# Patient Record
Sex: Male | Born: 1985 | Race: White | Hispanic: No | Marital: Married | Smoking: Never smoker
Health system: Southern US, Community
[De-identification: ages and names within clinical notes are randomized; demographics above are authoritative.]

## PROBLEM LIST (undated history)

## (undated) DIAGNOSIS — M5126 Other intervertebral disc displacement, lumbar region: Secondary | ICD-10-CM

## (undated) DIAGNOSIS — E785 Hyperlipidemia, unspecified: Secondary | ICD-10-CM

## (undated) HISTORY — DX: Other intervertebral disc displacement, lumbar region: M51.26

## (undated) HISTORY — DX: Hyperlipidemia, unspecified: E78.5

---
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
## 2012-07-18 ENCOUNTER — Encounter: Payer: Self-pay | Admitting: *Deleted

## 2012-07-27 ENCOUNTER — Ambulatory Visit (INDEPENDENT_AMBULATORY_CARE_PROVIDER_SITE_OTHER): Payer: BC Managed Care – PPO | Admitting: Family Medicine

## 2012-07-27 ENCOUNTER — Encounter: Payer: Self-pay | Admitting: Family Medicine

## 2012-07-27 VITALS — BP 126/78 | HR 86 | Temp 98.0°F | Resp 16 | Wt 187.0 lb

## 2012-07-27 DIAGNOSIS — L639 Alopecia areata, unspecified: Secondary | ICD-10-CM

## 2012-07-27 LAB — CBC WITH DIFFERENTIAL/PLATELET
Basophils Absolute: 0 10*3/uL (ref 0.0–0.1)
Basophils Relative: 1 % (ref 0–1)
Eosinophils Absolute: 0.3 10*3/uL (ref 0.0–0.7)
Eosinophils Relative: 4 % (ref 0–5)
HCT: 47.4 % (ref 39.0–52.0)
Hemoglobin: 16.7 g/dL (ref 13.0–17.0)
Lymphocytes Relative: 35 % (ref 12–46)
Lymphs Abs: 2.6 10*3/uL (ref 0.7–4.0)
MCH: 28.9 pg (ref 26.0–34.0)
MCHC: 35.2 g/dL (ref 30.0–36.0)
MCV: 82.1 fL (ref 78.0–100.0)
Monocytes Absolute: 0.7 10*3/uL (ref 0.1–1.0)
Monocytes Relative: 10 % (ref 3–12)
Neutro Abs: 3.9 10*3/uL (ref 1.7–7.7)
Neutrophils Relative %: 50 % (ref 43–77)
Platelets: 213 10*3/uL (ref 150–400)
RBC: 5.77 MIL/uL (ref 4.22–5.81)
RDW: 13.7 % (ref 11.5–15.5)
WBC: 7.6 10*3/uL (ref 4.0–10.5)

## 2012-07-27 LAB — BASIC METABOLIC PANEL
BUN: 16 mg/dL (ref 6–23)
CO2: 28 mEq/L (ref 19–32)
Calcium: 10.1 mg/dL (ref 8.4–10.5)
Chloride: 104 mEq/L (ref 96–112)
Creat: 0.94 mg/dL (ref 0.50–1.35)
Glucose, Bld: 84 mg/dL (ref 70–99)
Potassium: 4.8 mEq/L (ref 3.5–5.3)
Sodium: 141 mEq/L (ref 135–145)

## 2012-07-27 LAB — HEPATIC FUNCTION PANEL
ALT: 47 U/L (ref 0–53)
AST: 36 U/L (ref 0–37)
Albumin: 4.6 g/dL (ref 3.5–5.2)
Alkaline Phosphatase: 123 U/L — ABNORMAL HIGH (ref 39–117)
Bilirubin, Direct: 0.1 mg/dL (ref 0.0–0.3)
Indirect Bilirubin: 0.4 mg/dL (ref 0.0–0.9)
Total Bilirubin: 0.5 mg/dL (ref 0.3–1.2)
Total Protein: 7.9 g/dL (ref 6.0–8.3)

## 2012-07-27 LAB — TSH: TSH: 2.914 u[IU]/mL (ref 0.350–4.500)

## 2012-07-27 NOTE — Progress Notes (Signed)
Subjective:     Patient ID: Pedro Douglas, male   DOB: 08-11-1985, 27 y.o.   MRN: [id number]  HPI 27 year old white male presents with a 2 month history of hair loss.  There is a well circumscribed plaque on the crown of his head.  He has not been gradually enlarging.  He is completely bald in that spot.  There is no erythema or scale in the area of alopecia.  There are punctate hairs.  He has no hair loss anywhere else on his body including her eyelashes eyebrows genital region or beard.  Of note he has a 76-month-old newborn.  He states he has been under increased stress with the new addition.  He works as a Company secretary.  He is taking many medications, chemicals, and denies new exposures.  Past medical history is positive for asthma  He is on no medications  Has no allergies Review of Systems    review of systems is otherwise negative  Objective:   Physical Exam  Constitutional: He is oriented to person, place, and time. He appears well-developed and well-nourished.  HENT:  Head: Normocephalic and atraumatic.  Right Ear: External ear normal.  Left Ear: External ear normal.  Mouth/Throat: Oropharynx is clear and moist.  Eyes: Conjunctivae and EOM are normal. Pupils are equal, round, and reactive to light.  Neck: Normal range of motion. Neck supple. No thyromegaly present.  Cardiovascular: Normal rate, regular rhythm and normal heart sounds.  Exam reveals no friction rub.   No murmur heard. Pulmonary/Chest: Effort normal and breath sounds normal.  Abdominal: Soft. Bowel sounds are normal.  Musculoskeletal: Normal range of motion.  Neurological: He is oriented to person, place, and time. He has normal reflexes.   on the crown of his head, there is a 5 cm well-circumscribed patch of alopecia, without scale or erythema.  There are punctate hairs in the patch.       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 Discussed the natural history of alopecia areata advise him to clinically monitor for several months.Marland Kitchen  anticipate a self limited resolution as his stress level improves.  If symptoms worsen we can consult dermatology for possible intralesional corticosteroid injection.

## 2012-07-28 LAB — ANA: Anti Nuclear Antibody(ANA): NEGATIVE

## 2012-08-16 ENCOUNTER — Telehealth: Payer: Self-pay | Admitting: Family Medicine

## 2012-08-16 DIAGNOSIS — L639 Alopecia areata, unspecified: Secondary | ICD-10-CM

## 2012-08-16 NOTE — Telephone Encounter (Signed)
PT states he wants to discuss further treatment for his hair loss with dermatologist..says its getting better but not much

## 2012-08-16 NOTE — Telephone Encounter (Signed)
Tell him I will arrange [facility] consult.

## 2012-08-16 NOTE — Telephone Encounter (Signed)
Pt is aware and DERM appt has been set up for next Wednesday April 23

## 2012-12-23 ENCOUNTER — Encounter: Payer: Self-pay | Admitting: Family Medicine

## 2012-12-23 ENCOUNTER — Ambulatory Visit (INDEPENDENT_AMBULATORY_CARE_PROVIDER_SITE_OTHER): Payer: PRIVATE HEALTH INSURANCE | Admitting: Family Medicine

## 2012-12-23 VITALS — BP 100/60 | HR 60 | Temp 98.2°F | Resp 12 | Wt 176.0 lb

## 2012-12-23 DIAGNOSIS — J019 Acute sinusitis, unspecified: Secondary | ICD-10-CM

## 2012-12-23 MED ORDER — AMOXICILLIN 875 MG PO TABS: 875.0000 mg | ORAL_TABLET | Freq: Two times a day (BID) | ORAL | Status: DC

## 2012-12-23 MED ORDER — FLUTICASONE PROPIONATE 50 MCG/ACT NA SUSP: 2.0000 | Freq: Every day | NASAL | Status: DC

## 2012-12-23 NOTE — Progress Notes (Signed)
  Subjective:    Patient ID: Pedro Douglas, male    DOB: Feb 29, 1986, 27 y.o.   MRN: [id number]  HPI Patient had 2 weeks of sinus congestion, sinus pressure, and pain in the right maxillary sinus. He has had postnasal drip and sore throat. He has had subjective fevers. He denies any cough. Both ears feel full and congested. He is also feeling dizzy.  He also reports vague aching pain in his teeth. Past Medical History  Diagnosis Date  . Asthma 1996   No current outpatient prescriptions on file prior to visit.   No current facility-administered medications on file prior to visit.   No Known Allergies History   Social History  . Marital Status: Single    Spouse Name: N/A    Number of Children: N/A  . Years of Education: N/A   Occupational History  . Not on file.   Social History Main Topics  . Smoking status: Never Smoker   . Smokeless tobacco: Former Neurosurgeon    Types: Chew    Quit date: 05/06/2012  . Alcohol Use: No  . Drug Use: No  . Sexual Activity: Not on file   Other Topics Concern  . Not on file   Social History Narrative  . No narrative on file      Review of Systems  All other systems reviewed and are negative.       Objective:   Physical Exam  Vitals reviewed. Constitutional: He appears well-developed and well-nourished. No distress.  HENT:  Head: Normocephalic.  Right Ear: External ear normal.  Left Ear: External ear normal.  Nose: Right sinus exhibits maxillary sinus tenderness. Right sinus exhibits no frontal sinus tenderness. Left sinus exhibits no maxillary sinus tenderness and no frontal sinus tenderness.  Mouth/Throat: Oropharynx is clear and moist. No oropharyngeal exudate.  Eyes: Conjunctivae and EOM are normal. Pupils are equal, round, and reactive to light. Right eye exhibits no discharge. Left eye exhibits no discharge. No scleral icterus.  Neck: Normal range of motion. Neck supple. No JVD present.  Cardiovascular: Normal rate, regular  rhythm and normal heart sounds.  Exam reveals no gallop and no friction rub.   No murmur heard. Pulmonary/Chest: Effort normal and breath sounds normal. No respiratory distress. He has no wheezes. He has no rales.  Abdominal: Soft. Bowel sounds are normal.  Lymphadenopathy:    He has no cervical adenopathy.  Skin: He is not diaphoretic.          Assessment & Plan:  1. Acute rhinosinusitis Begin nasal saline 4 times a day, amoxicillin 875 mg by mouth twice a day for 10 days, and Flonase 2 sprays each nostril daily. Recheck in 1 week if no better or sooner if worse. - amoxicillin (AMOXIL) 875 MG tablet; Take 1 tablet (875 mg total) by mouth 2 (two) times daily.  Dispense: 20 tablet; Refill: 0 - fluticasone (FLONASE) 50 MCG/ACT nasal spray; Place 2 sprays into the nose daily.  Dispense: 16 g; Refill: 6

## 2013-01-28 ENCOUNTER — Ambulatory Visit: Payer: PRIVATE HEALTH INSURANCE | Admitting: Family Medicine

## 2013-01-31 ENCOUNTER — Ambulatory Visit (INDEPENDENT_AMBULATORY_CARE_PROVIDER_SITE_OTHER): Payer: PRIVATE HEALTH INSURANCE | Admitting: Family Medicine

## 2013-01-31 ENCOUNTER — Encounter: Payer: Self-pay | Admitting: Family Medicine

## 2013-01-31 VITALS — BP 120/78 | HR 62 | Temp 97.8°F | Resp 14 | Ht 68.5 in | Wt 178.0 lb

## 2013-01-31 DIAGNOSIS — J329 Chronic sinusitis, unspecified: Secondary | ICD-10-CM

## 2013-01-31 MED ORDER — AMOXICILLIN-POT CLAVULANATE 875-125 MG PO TABS: 1.0000 | ORAL_TABLET | Freq: Two times a day (BID) | ORAL | Status: DC

## 2013-01-31 MED ORDER — PREDNISONE 20 MG PO TABS: ORAL_TABLET | ORAL | Status: DC

## 2013-01-31 NOTE — Progress Notes (Signed)
Subjective:    Patient ID: Pedro Douglas, male    DOB: 1985/11/11, 27 y.o.   MRN: [id number]  HPI 12/23/12 Patient had 2 weeks of sinus congestion, sinus pressure, and pain in the right maxillary sinus. He has had postnasal drip and sore throat. He has had subjective fevers. He denies any cough. Both ears feel full and congested. He is also feeling dizzy.  He also reports vague aching pain in his teeth.  At tht time, my plan was: 1. Acute rhinosinusitis Begin nasal saline 4 times a day, amoxicillin 875 mg by mouth twice a day for 10 days, and Flonase 2 sprays each nostril daily. Recheck in 1 week if no better or sooner if worse. - amoxicillin (AMOXIL) 875 MG tablet; Take 1 tablet (875 mg total) by mouth 2 (two) times daily.  Dispense: 20 tablet; Refill: 0 - fluticasone (FLONASE) 50 MCG/ACT nasal spray; Place 2 sprays into the nose daily.  Dispense: 16 g; Refill: 6  01/31/13 Patient states that he is no better. In fact his right nostril is completely occluded. He cannot breathe through his right nostril. He feels like he has a plug of mucus in his right nostril and right maxillary sinus to just will not drain. He is tried saline 4 times a day without benefit. He is tried the [employer]. He states that the nose is so swollen however that the Flonase cannot penetrate through the mucous.  He denies any fevers or chills. He denies any sinus pain at the present time. He does report constant sinus pressure and headaches. Past Medical History  Diagnosis Date  . Asthma 1996   Current Outpatient Prescriptions on File Prior to Visit  Medication Sig Dispense Refill  . fluticasone (FLONASE) 50 MCG/ACT nasal spray Place 2 sprays into the nose daily.  16 g  6   No current facility-administered medications on file prior to visit.   No Known Allergies History   Social History  . Marital Status: Single    Spouse Name: N/A    Number of Children: N/A  . Years of Education: N/A   Occupational History  .  Not on file.   Social History Main Topics  . Smoking status: Never Smoker   . Smokeless tobacco: Former Neurosurgeon    Types: Chew    Quit date: 05/06/2012  . Alcohol Use: No  . Drug Use: No  . Sexual Activity: Not on file   Other Topics Concern  . Not on file   Social History Narrative  . No narrative on file      Review of Systems  All other systems reviewed and are negative.       Objective:   Physical Exam  Vitals reviewed. Constitutional: He appears well-developed and well-nourished. No distress.  HENT:  Head: Normocephalic.  Right Ear: External ear normal.  Left Ear: External ear normal.  Nose: Right sinus exhibits maxillary sinus tenderness. Right sinus exhibits no frontal sinus tenderness. Left sinus exhibits no maxillary sinus tenderness and no frontal sinus tenderness.  Mouth/Throat: Oropharynx is clear and moist. No oropharyngeal exudate.  Eyes: Conjunctivae and EOM are normal. Pupils are equal, round, and reactive to light. Right eye exhibits no discharge. Left eye exhibits no discharge. No scleral icterus.  Neck: Normal range of motion. Neck supple. No JVD present.  Cardiovascular: Normal rate, regular rhythm and normal heart sounds.  Exam reveals no gallop and no friction rub.   No murmur heard. Pulmonary/Chest: Effort normal and breath sounds normal.  No respiratory distress. He has no wheezes. He has no rales.  Abdominal: Soft. Bowel sounds are normal.  Lymphadenopathy:    He has no cervical adenopathy.  Skin: He is not diaphoretic.          Assessment & Plan:  1. Chronic rhinosinusitis I recommended Afrin 2 sprays each nostril twice a day for 3 days. This is decreased mucosal swelling so the Flonase and nasal saline can penetrate.  Begin nasal saline 4 times a day and continue Flonase. Also start a prednisone taper pack along with Augmentin 875 mg by mouth twice a day for 10 days. If symptoms are not better in one to 2 weeks, I would obtain a CT scan of  the sinuses and possibly an ENT consult - predniSONE (DELTASONE) 20 MG tablet; 3 tabs poqday 1-2, 2 tabs poqday 3-4, 1 tab poqday 5-6  Dispense: 12 tablet; Refill: 0 - amoxicillin-clavulanate (AUGMENTIN) 875-125 MG per tablet; Take 1 tablet by mouth 2 (two) times daily.  Dispense: 20 tablet; Refill: 0

## 2013-09-16 ENCOUNTER — Encounter (HOSPITAL_COMMUNITY): Payer: Self-pay | Admitting: Emergency Medicine

## 2013-09-16 DIAGNOSIS — R112 Nausea with vomiting, unspecified: Secondary | ICD-10-CM | POA: Insufficient documentation

## 2013-09-16 DIAGNOSIS — R109 Unspecified abdominal pain: Secondary | ICD-10-CM | POA: Insufficient documentation

## 2013-09-16 DIAGNOSIS — R197 Diarrhea, unspecified: Secondary | ICD-10-CM | POA: Insufficient documentation

## 2013-09-16 NOTE — ED Notes (Signed)
[place] [employer] [place]. Pt states he began having abdominal pain, nausea, vomiting, and diarrhea around 4:30pm.

## 2013-09-17 ENCOUNTER — Emergency Department (HOSPITAL_COMMUNITY)
Admission: EM | Admit: 2013-09-17 | Discharge: 2013-09-17 | Disposition: A | Discharge status: Home / Self Care | Payer: PRIVATE HEALTH INSURANCE | Attending: Emergency Medicine | Admitting: Emergency Medicine

## 2013-09-17 MED ORDER — ONDANSETRON 8 MG PO TBDP: 8.0000 mg | ORAL_TABLET | Freq: Once | ORAL | Status: DC

## 2013-10-22 ENCOUNTER — Encounter: Payer: Self-pay | Admitting: Nurse Practitioner

## 2013-10-22 ENCOUNTER — Ambulatory Visit (INDEPENDENT_AMBULATORY_CARE_PROVIDER_SITE_OTHER): Payer: PRIVATE HEALTH INSURANCE | Admitting: Nurse Practitioner

## 2013-10-22 VITALS — BP 124/82 | HR 68 | Temp 97.1°F | Ht 68.5 in | Wt 179.0 lb

## 2013-10-22 DIAGNOSIS — L259 Unspecified contact dermatitis, unspecified cause: Secondary | ICD-10-CM

## 2013-10-22 MED ORDER — PREDNISONE 20 MG PO TABS: ORAL_TABLET | ORAL | Status: DC

## 2013-10-22 NOTE — Patient Instructions (Signed)
Poison Oak Poison oak is an inflammation of the skin (contact dermatitis). It is caused by contact with the allergens on the leaves of the oak (toxicodendron) plants. Depending on your sensitivity, the rash may consist simply of redness and itching, or it may also progress to blisters which may break open (rupture). These must be well cared for to prevent secondary germ (bacterial) infection as these infections can lead to scarring. The eyes may also get puffy. The puffiness is worst in the morning and gets better as the day progresses. Healing is best accomplished by keeping any open areas dry, clean, covered with a bandage, and covered with an antibacterial ointment if needed. Without secondary infection, this dermatitis usually heals without scarring within 2 to 3 weeks without treatment. HOME CARE INSTRUCTIONS When you have been exposed to poison oak, it is very important to thoroughly wash with soap and water as soon as the exposure has been discovered. You have about one half hour to remove the plant resin before it will cause the rash. This cleaning will quickly destroy the oil or antigen on the skin (the antigen is what causes the rash). Wash aggressively under the fingernails as any plant resin still there will continue to spread the rash. Do not rub skin vigorously when washing affected area. Poison oak cannot spread if no oil from the plant remains on your body. Rash that has progressed to weeping sores (lesions) will not spread the rash unless you have not washed thoroughly. It is also important to clean any clothes you have been wearing as they may carry active allergens which will spread the rash, even several days later. Avoidance of the plant in the future is the best measure. Poison oak plants can be recognized by the number of leaves. Generally, poison oak has three leaves with flowering branches on a single stem. Diphenhydramine may be purchased over the counter and used as needed for  itching. Do not drive with this medication if it makes you drowsy. Ask your caregiver about medication for children. SEEK IMMEDIATE MEDICAL CARE IF:   Open areas of the rash develop.  You notice redness extending beyond the area of the rash.  There is a pus like discharge.  There is increased pain.  Other signs of infection develop (such as fever). Document Released: 10/26/2002 Document Revised: 07/14/2011 Document Reviewed: 03/07/2009 Exit[employer] Patient Information 2015 [employer]. This information is not intended to replace advice given to you by your health care provider. Make sure you discuss any questions you have with your health care provider.  

## 2013-10-22 NOTE — Progress Notes (Signed)
   Subjective:    Patient ID: Pedro Douglas, male    DOB: 07-Apr-1986, 28 y.o.   MRN: [id number]  HPI [name] in today c/o rash that started Monday of this past week- Has been clearing land and thinks got into poison oak.    Review of Systems  Constitutional: Negative.   HENT: Negative.   Respiratory: Negative.   Cardiovascular: Negative.   Genitourinary: Negative.   Neurological: Negative.   Psychiatric/Behavioral: Negative.   All other systems reviewed and are negative.      Objective:   Physical Exam  Constitutional: He is oriented to person, place, and time. He appears well-developed and well-nourished.  Cardiovascular: Normal rate, regular rhythm and normal heart sounds.   Pulmonary/Chest: Effort normal and breath sounds normal.  Neurological: He is alert and oriented to person, place, and time.  Skin: Skin is warm. Rash noted.  Erythematous vesicular lesions in a linear pattern on n=bil hands and forearm.   BP 124/82  Pulse 68  Temp(Src) 97.1 F (36.2 C) (Oral)  Ht 5' 8.5" (1.74 m)  Wt 179 lb (81.194 kg)  BMI 26.82 kg/m2        Assessment & Plan:   1. Contact dermatitis    Meds ordered this encounter  Medications  . predniSONE (DELTASONE) 20 MG tablet    Sig: 4 tablets po today then 2 tablets at the same time daily for 5 days    Dispense:  14 tablet    Refill:  0    Order Specific Question:  Supervising Provider    Answer:  Ernestina PennaMOORE, DONALD W [[id number]]   Avoid scratching Cool compresses rto prn  Pedro Daphine DeutscherMartin, FNP

## 2014-05-30 ENCOUNTER — Encounter: Payer: Self-pay | Admitting: Family Medicine

## 2014-05-30 ENCOUNTER — Ambulatory Visit (INDEPENDENT_AMBULATORY_CARE_PROVIDER_SITE_OTHER): Payer: PRIVATE HEALTH INSURANCE | Admitting: Family Medicine

## 2014-05-30 VITALS — BP 102/68 | HR 64 | Temp 98.5°F | Resp 12 | Ht 68.5 in | Wt 178.0 lb

## 2014-05-30 DIAGNOSIS — Z0000 Encounter for general adult medical examination without abnormal findings: Secondary | ICD-10-CM

## 2014-05-30 DIAGNOSIS — N50811 Right testicular pain: Secondary | ICD-10-CM

## 2014-05-30 DIAGNOSIS — N508 Other specified disorders of male genital organs: Secondary | ICD-10-CM

## 2014-05-30 LAB — LIPID PANEL
CHOLESTEROL: 179 mg/dL (ref 0–200)
HDL: 41 mg/dL (ref >39)
LDL Cholesterol: 119 mg/dL — ABNORMAL HIGH (ref 0–99)
Total CHOL/HDL Ratio: 4.4 Ratio
Triglycerides: 97 mg/dL (ref <150)
VLDL: 19 mg/dL (ref 0–40)

## 2014-05-30 LAB — CBC WITH DIFFERENTIAL/PLATELET
BASOS PCT: 1 % (ref 0–1)
Basophils Absolute: 0.1 10*3/uL (ref 0.0–0.1)
Eosinophils Absolute: 0.1 10*3/uL (ref 0.0–0.7)
Eosinophils Relative: 2 % (ref 0–5)
HEMATOCRIT: 49.5 % (ref 39.0–52.0)
HEMOGLOBIN: 16.7 g/dL (ref 13.0–17.0)
Lymphocytes Relative: 38 % (ref 12–46)
Lymphs Abs: 2.6 10*3/uL (ref 0.7–4.0)
MCH: 28.5 pg (ref 26.0–34.0)
MCHC: 33.7 g/dL (ref 30.0–36.0)
MCV: 84.5 fL (ref 78.0–100.0)
MONO ABS: 0.7 10*3/uL (ref 0.1–1.0)
MPV: 10.1 fL (ref 8.6–12.4)
Monocytes Relative: 10 % (ref 3–12)
NEUTROS PCT: 49 % (ref 43–77)
Neutro Abs: 3.4 10*3/uL (ref 1.7–7.7)
Platelets: 231 10*3/uL (ref 150–400)
RBC: 5.86 MIL/uL — AB (ref 4.22–5.81)
RDW: 13.3 % (ref 11.5–15.5)
WBC: 6.9 10*3/uL (ref 4.0–10.5)

## 2014-05-30 LAB — COMPLETE METABOLIC PANEL WITH GFR
ALT: 23 U/L (ref 0–53)
AST: 17 U/L (ref 0–37)
Albumin: 4.5 g/dL (ref 3.5–5.2)
Alkaline Phosphatase: 108 U/L (ref 39–117)
BILIRUBIN TOTAL: 0.5 mg/dL (ref 0.2–1.2)
BUN: 10 mg/dL (ref 6–23)
CALCIUM: 9.8 mg/dL (ref 8.4–10.5)
CHLORIDE: 103 meq/L (ref 96–112)
CO2: 27 mEq/L (ref 19–32)
CREATININE: 0.95 mg/dL (ref 0.50–1.35)
GFR, Est African American: >89 mL/min
GFR, Est Non African American: >89 mL/min
Glucose, Bld: 88 mg/dL (ref 70–99)
Potassium: 4.4 mEq/L (ref 3.5–5.3)
Sodium: 139 mEq/L (ref 135–145)
Total Protein: 7.8 g/dL (ref 6.0–8.3)

## 2014-05-30 NOTE — Progress Notes (Signed)
Subjective:    Patient ID: Pedro Douglas, male    DOB: 09/18/1985, 29 y.o.   MRN: [id number]  HPI Patient is here today for complete physical exam. He is also been having intermittent right testicular pain for the last month. Pain began after he was exercising vigorously. The patient does wear boxers. With exercise the pain tends to be worse. He denies any injury. He denies any dysuria or hematuria. He denies any fever. He denies any exposure to gonorrhea or chlamydia. Otherwise he is doing well. His flu shot is up-to-date. He is overdue for fasting lab work. No past medical history on file. Past Surgical History  Procedure Laterality Date  . Tympanostomy tube placement Bilateral 1990   No current outpatient prescriptions on file prior to visit.   No current facility-administered medications on file prior to visit.   Allergies  Allergen Reactions  . Banana   . Other   . Watermelon [Citrullus Vulgaris]    History   Social History  . Marital Status: Single    Spouse Name: N/A    Number of Children: N/A  . Years of Education: N/A   Occupational History  . Not on file.   Social History Main Topics  . Smoking status: Never Smoker   . Smokeless tobacco: Former NeurosurgeonUser    Types: Chew    Quit date: 05/06/2012  . Alcohol Use: No  . Drug Use: No  . Sexual Activity: Not on file   Other Topics Concern  . Not on file   Social History Narrative   Family History  Problem Relation Age of Onset  . Cancer Mother     cervical  . Heart disease Paternal Uncle   . Cancer Maternal Grandmother     cervical  . Diabetes Paternal Grandmother       Review of Systems  All other systems reviewed and are negative.      Objective:   Physical Exam  Constitutional: He is oriented to person, place, and time. He appears well-developed and well-nourished. No distress.  HENT:  Head: Normocephalic and atraumatic.  Right Ear: External ear normal.  Left Ear: External ear normal.    Nose: Nose normal.  Mouth/Throat: Oropharynx is clear and moist. No oropharyngeal exudate.  Eyes: Conjunctivae and EOM are normal. Pupils are equal, round, and reactive to light. Right eye exhibits no discharge. Left eye exhibits no discharge. No scleral icterus.  Neck: Normal range of motion. Neck supple. No JVD present. No tracheal deviation present. No thyromegaly present.  Cardiovascular: Normal rate, regular rhythm, normal heart sounds and intact distal pulses.  Exam reveals no gallop and no friction rub.   No murmur heard. Pulmonary/Chest: Effort normal and breath sounds normal. No stridor. No respiratory distress. He has no wheezes. He has no rales. He exhibits no tenderness.  Abdominal: Soft. Bowel sounds are normal. He exhibits no distension and no mass. There is no tenderness. There is no rebound and no guarding.  Genitourinary: Penis normal. Cremasteric reflex is present. Right testis shows no mass, no swelling and no tenderness. Left testis shows no mass, no swelling and no tenderness. No penile tenderness.  Musculoskeletal: Normal range of motion. He exhibits no edema or tenderness.  Lymphadenopathy:    He has no cervical adenopathy.  Neurological: He is alert and oriented to person, place, and time. He has normal reflexes. He displays normal reflexes. No cranial nerve deficit. He exhibits normal muscle tone. Coordination normal.  Skin: Skin is warm. No  rash noted. He is not diaphoretic. No erythema. No pallor.  Psychiatric: He has a normal mood and affect. His behavior is normal. Judgment and thought content normal.  Vitals reviewed.         Assessment & Plan:  Routine general medical examination at a health care facility - Plan: COMPLETE METABOLIC PANEL WITH GFR, CBC with Differential/Platelet, Lipid panel  Right testicular pain - Plan: US Scrotum  Patient's physical exam is completely normal. Patient's immunizations are up-to-date. I will check a CBC, CMP, fasting lipid  panel. I believe the pain is musculoskeletal in nature and likely due to irritation from lack of wearing supportive undergarments while exercising. I recommended that the patient switch to briefs and take ibuprofen 800 mg as needed for pain. I will schedule ultrasound of the scrotum to exclude testicular pathology but his testicular exam today is completely normal.

## 2014-06-01 ENCOUNTER — Encounter: Payer: Self-pay | Admitting: *Deleted

## 2014-06-01 ENCOUNTER — Ambulatory Visit (HOSPITAL_COMMUNITY)
Admission: RE | Admit: 2014-06-01 | Discharge: 2014-06-01 | Disposition: A | Discharge status: Home / Self Care | Payer: PRIVATE HEALTH INSURANCE | Source: Ambulatory Visit | Attending: Family Medicine | Admitting: Family Medicine

## 2014-06-01 ENCOUNTER — Other Ambulatory Visit: Payer: Self-pay | Admitting: Family Medicine

## 2014-06-01 DIAGNOSIS — N508 Other specified disorders of male genital organs: Secondary | ICD-10-CM | POA: Diagnosis not present

## 2014-06-01 DIAGNOSIS — N50811 Right testicular pain: Secondary | ICD-10-CM

## 2014-06-02 ENCOUNTER — Other Ambulatory Visit (HOSPITAL_COMMUNITY): Payer: PRIVATE HEALTH INSURANCE

## 2014-06-06 ENCOUNTER — Encounter: Payer: Self-pay | Admitting: *Deleted

## 2014-09-08 ENCOUNTER — Encounter: Payer: Self-pay | Admitting: Family Medicine

## 2014-09-08 ENCOUNTER — Ambulatory Visit (INDEPENDENT_AMBULATORY_CARE_PROVIDER_SITE_OTHER): Payer: PRIVATE HEALTH INSURANCE | Admitting: Family Medicine

## 2014-09-08 VITALS — BP 110/62 | HR 58 | Temp 98.0°F | Resp 14 | Ht 68.5 in | Wt 173.0 lb

## 2014-09-08 DIAGNOSIS — L247 Irritant contact dermatitis due to plants, except food: Secondary | ICD-10-CM

## 2014-09-08 MED ORDER — PREDNISONE 20 MG PO TABS: ORAL_TABLET | ORAL | Status: DC

## 2014-09-08 MED ORDER — FLUOCINONIDE-E 0.05 % EX CREA: 1.0000 "application " | TOPICAL_CREAM | Freq: Two times a day (BID) | CUTANEOUS | Status: DC

## 2014-09-08 MED ORDER — FLUTICASONE PROPIONATE 50 MCG/ACT NA SUSP: 2.0000 | Freq: Every day | NASAL | Status: DC

## 2014-09-08 NOTE — Progress Notes (Signed)
   Subjective:    Patient ID: Pedro Douglas, male    DOB: [birth year], 29 y.o.   MRN: [id number]  HPI  A she was doing yard work this weekend. Ever since he has had a rash on his left arm. The rash consists of erythematous papules in a linear distribution in his antecubital fossa as well as on his volar forearm. It appears to be a contact dermatitis No past medical history on file. Past Surgical History  Procedure Laterality Date  . Tympanostomy tube placement Bilateral 1990   No current outpatient prescriptions on file prior to visit.   No current facility-administered medications on file prior to visit.   Allergies  Allergen Reactions  . Banana   . Other   . Watermelon [Citrullus Vulgaris]    History   Social History  . Marital Status: Single    Spouse Name: N/A  . Number of Children: N/A  . Years of Education: N/A   Occupational History  . Not on file.   Social History Main Topics  . Smoking status: Never Smoker   . Smokeless tobacco: Former NeurosurgeonUser    Types: Chew    Quit date: 05/06/2012  . Alcohol Use: No  . Drug Use: No  . Sexual Activity: Not on file   Other Topics Concern  . Not on file   Social History Narrative     Review of Systems  All other systems reviewed and are negative.      Objective:   Physical Exam  Cardiovascular: Normal rate, regular rhythm and normal heart sounds.   Pulmonary/Chest: Effort normal and breath sounds normal.  Skin: Rash noted.  Vitals reviewed.         Assessment & Plan:  Contact dermatitis and eczema due to plant - Plan: predniSONE (DELTASONE) 20 MG tablet, fluocinonide-emollient (LIDEX-E) 0.05 % cream  This appears to be contact dermatitis due to poison oak or poison ivy. I gave the patient Lidex cream. He is to apply the cream once a day for the next week to calm it down. I did give him a prescription for prednisone in case the rash spreads or becomes much worse. However I would not use the prednisone unless  it worsens dramatically. The patient understands and will hold off on getting the prescription unless it worsens dramatically

## 2014-10-01 ENCOUNTER — Other Ambulatory Visit: Payer: Self-pay | Admitting: Family Medicine

## 2014-12-08 ENCOUNTER — Encounter: Payer: Self-pay | Admitting: Family Medicine

## 2014-12-08 ENCOUNTER — Ambulatory Visit (INDEPENDENT_AMBULATORY_CARE_PROVIDER_SITE_OTHER): Payer: PRIVATE HEALTH INSURANCE | Admitting: Family Medicine

## 2014-12-08 VITALS — BP 120/74 | HR 60 | Temp 98.4°F | Resp 12 | Ht 68.5 in | Wt 172.0 lb

## 2014-12-08 DIAGNOSIS — J029 Acute pharyngitis, unspecified: Secondary | ICD-10-CM

## 2014-12-08 LAB — RAPID STREP SCREEN (MED CTR MEBANE ONLY): STREPTOCOCCUS, GROUP A SCREEN (DIRECT): NEGATIVE

## 2014-12-08 MED ORDER — AMOXICILLIN 875 MG PO TABS: 875.0000 mg | ORAL_TABLET | Freq: Two times a day (BID) | ORAL | Status: DC

## 2014-12-08 NOTE — Progress Notes (Signed)
   Subjective:    Patient ID: Pedro Douglas, male    DOB: August 02, 1985, 29 y.o.   MRN: [id number]  HPI Patient has had a severe sore throat for 24 hours. Patient describes it as swallowing razor blades. Pain is more intense than any other sore throat he is ever had. On examination there is significant erythema in his posterior oropharynx although his strep test is negative. He denies any cough. He reports pain in his neck. He has tender cervical lymphadenopathy bilaterally. He has mild rhinorrhea. He denies any nausea or vomiting. He denies any rash. No past medical history on file. Past Surgical History  Procedure Laterality Date  . Tympanostomy tube placement Bilateral 1990   Current Outpatient Prescriptions on File Prior to Visit  Medication Sig Dispense Refill  . fluticasone (FLONASE) 50 MCG/ACT nasal spray Place 2 sprays into both nostrils daily. 16 g 11   No current facility-administered medications on file prior to visit.   Allergies  Allergen Reactions  . Banana   . Other   . Watermelon [Citrullus Vulgaris]    History   Social History  . Marital Status: Single    Spouse Name: N/A  . Number of Children: N/A  . Years of Education: N/A   Occupational History  . Not on file.   Social History Main Topics  . Smoking status: Never Smoker   . Smokeless tobacco: Former Neurosurgeon    Types: Chew    Quit date: 05/06/2012  . Alcohol Use: No  . Drug Use: No  . Sexual Activity: Not on file   Other Topics Concern  . Not on file   Social History Narrative     Review of Systems  All other systems reviewed and are negative.      Objective:   Physical Exam  HENT:  Right Ear: External ear normal.  Left Ear: External ear normal.  Nose: Nose normal.  Mouth/Throat: Posterior oropharyngeal edema and posterior oropharyngeal erythema present. No oropharyngeal exudate or tonsillar abscesses.  Eyes: Conjunctivae are normal.  Neck: Neck supple.  Cardiovascular: Normal rate,  regular rhythm and normal heart sounds.   Pulmonary/Chest: Effort normal and breath sounds normal.  Lymphadenopathy:    He has cervical adenopathy.  Vitals reviewed.         Assessment & Plan:  Sore throat - Plan: Rapid strep screen (not at [facility]), amoxicillin (AMOXIL) 875 MG tablet  Clinically the patient has strep throat. I'll treat with amoxicillin 875 mg by mouth twice a day for 10 days. I believe his symptoms and exam are out of proportion to what I would expect from a simple virus. Furthermore he has no known exposure to mono.

## 2015-12-11 ENCOUNTER — Ambulatory Visit (INDEPENDENT_AMBULATORY_CARE_PROVIDER_SITE_OTHER): Payer: PRIVATE HEALTH INSURANCE | Admitting: Family Medicine

## 2015-12-11 ENCOUNTER — Encounter: Payer: Self-pay | Admitting: Family Medicine

## 2015-12-11 VITALS — BP 118/74 | HR 64 | Temp 97.8°F | Resp 20 | Wt 190.0 lb

## 2015-12-11 DIAGNOSIS — Z0000 Encounter for general adult medical examination without abnormal findings: Secondary | ICD-10-CM

## 2015-12-11 DIAGNOSIS — J029 Acute pharyngitis, unspecified: Secondary | ICD-10-CM | POA: Diagnosis not present

## 2015-12-11 MED ORDER — FLUTICASONE PROPIONATE 50 MCG/ACT NA SUSP: 2.0000 | Freq: Every day | NASAL | 11 refills | Status: DC

## 2015-12-11 MED ORDER — AMOXICILLIN 875 MG PO TABS: 875.0000 mg | ORAL_TABLET | Freq: Two times a day (BID) | ORAL | 0 refills | Status: DC

## 2015-12-11 NOTE — Progress Notes (Signed)
Subjective:    Patient ID: Pedro Douglas, male    DOB: Jul 25, 1985, 30 y.o.   MRN: [id number]  HPI Patient is here today for complete physical exam.Please see last year's office visit. At that time he was having significant testicular pain. His exam is completely normal. Subsequently we ordered an ultrasound of the scrotum. Blood flow to the testicles is normal. There is no testicular masses. He did have 2 small: Subtle findings of hydroceles. They were not the cause of his pain. The patient changed his exercise routine. He is not doing as much running. Subsequently the pain in his testicles have improved. He does complain of some sinus irritation. He does not believe he has a full-blown sinus infection yet but he is requesting a refill on his Flonase. His lab work from last year is outstanding. I reviewed that at this time. CBC, CMP, fasting lipid panel all within normal limits. In such a young healthy man I see no reason to repeat it this year. I would repeat it next year. Otherwise his review of systems is completely normal  No past medical history on file. Past Surgical History:  Procedure Laterality Date  . TYMPANOSTOMY TUBE PLACEMENT Bilateral 1990   Current Outpatient Prescriptions on File Prior to Visit  Medication Sig Dispense Refill  . fluticasone (FLONASE) 50 MCG/ACT nasal spray Place 2 sprays into both nostrils daily. 16 g 11   No current facility-administered medications on file prior to visit.    Allergies  Allergen Reactions  . Banana   . Other   . Watermelon [Citrullus Vulgaris]    Social History   Social History  . Marital status: Single    Spouse name: N/A  . Number of children: N/A  . Years of education: N/A   Occupational History  . Not on file.   Social History Main Topics  . Smoking status: Never Smoker  . Smokeless tobacco: Former Neurosurgeon    Types: Chew    Quit date: 05/06/2012  . Alcohol use No  . Drug use: No  . Sexual activity: Not on file   Other  Topics Concern  . Not on file   Social History Narrative  . No narrative on file   Family History  Problem Relation Age of Onset  . Cancer Mother     cervical  . Heart disease Paternal Uncle   . Cancer Maternal Grandmother     cervical  . Diabetes Paternal Grandmother       Review of Systems  All other systems reviewed and are negative.      Objective:   Physical Exam  Constitutional: He is oriented to person, place, and time. He appears well-developed and well-nourished. No distress.  HENT:  Head: Normocephalic and atraumatic.  Right Ear: External ear normal.  Left Ear: External ear normal.  Nose: Nose normal.  Mouth/Throat: Oropharynx is clear and moist. No oropharyngeal exudate.  Eyes: Conjunctivae and EOM are normal. Pupils are equal, round, and reactive to light. Right eye exhibits no discharge. Left eye exhibits no discharge. No scleral icterus.  Neck: Normal range of motion. Neck supple. No JVD present. No tracheal deviation present. No thyromegaly present.  Cardiovascular: Normal rate, regular rhythm, normal heart sounds and intact distal pulses.  Exam reveals no gallop and no friction rub.   No murmur heard. Pulmonary/Chest: Effort normal and breath sounds normal. No stridor. No respiratory distress. He has no wheezes. He has no rales. He exhibits no tenderness.  Abdominal: Soft.  Bowel sounds are normal. He exhibits no distension and no mass. There is no tenderness. There is no rebound and no guarding.  Genitourinary: Penis normal. Cremasteric reflex is present. Right testis shows no mass, no swelling and no tenderness. Left testis shows no mass, no swelling and no tenderness. No penile tenderness.  Musculoskeletal: Normal range of motion. He exhibits no edema or tenderness.  Lymphadenopathy:    He has no cervical adenopathy.  Neurological: He is alert and oriented to person, place, and time. He has normal reflexes. No cranial nerve deficit. He exhibits normal  muscle tone. Coordination normal.  Skin: Skin is warm. No rash noted. He is not diaphoretic. No erythema. No pallor.  Psychiatric: He has a normal mood and affect. His behavior is normal. Judgment and thought content normal.  Vitals reviewed.         Assessment & Plan:  Routine general medical examination at a health care facility  Patient's physical exam is completely normal. Patient's immunizations are up-to-date. Routine anticipatory guidance is provided. I did give the patient refill his Flonase 2 sprays each nostril daily. Should he develop fevers chills or headache, he can take amoxicillin for sinus infection but I see no indication for that this time. I believe the source of Flonase prophylactically he will prevent requiring this later

## 2016-05-13 ENCOUNTER — Ambulatory Visit (INDEPENDENT_AMBULATORY_CARE_PROVIDER_SITE_OTHER): Payer: PRIVATE HEALTH INSURANCE | Admitting: Family Medicine

## 2016-05-13 ENCOUNTER — Encounter: Payer: Self-pay | Admitting: Family Medicine

## 2016-05-13 VITALS — BP 110/74 | HR 56 | Temp 98.4°F | Resp 14 | Ht 68.5 in | Wt 182.0 lb

## 2016-05-13 DIAGNOSIS — J029 Acute pharyngitis, unspecified: Secondary | ICD-10-CM | POA: Diagnosis not present

## 2016-05-13 LAB — STREP GROUP A AG, W/REFLEX TO CULT: STREGTOCOCCUS GROUP A AG SCREEN: NOT DETECTED

## 2016-05-13 MED ORDER — AMOXICILLIN 875 MG PO TABS: 875.0000 mg | ORAL_TABLET | Freq: Two times a day (BID) | ORAL | 0 refills | Status: DC

## 2016-05-13 NOTE — Progress Notes (Signed)
   Subjective:    Patient ID: Pedro Douglas, male    DOB: Mar 11, 1986, 31 y.o.   MRN: [id number]  HPI Patient has had a severe sore throat for 1 week. He reports difficulty swallowing, severe pain with swallowing. Symptoms have been worsening over the last week not getting better. He reports subjective fevers and chills at night. He denies any rhinorrhea. He denies any sinus pain. He denies any cough. He denies any nausea or vomiting. He denies any otalgia. On examination today, the posterior oropharynx is erythematous.  He denies any sick contacts that he is aware of  No past medical history on file. Past Surgical History:  Procedure Laterality Date  . TYMPANOSTOMY TUBE PLACEMENT Bilateral 1990   No current outpatient prescriptions on file prior to visit.   No current facility-administered medications on file prior to visit.    Allergies  Allergen Reactions  . Banana   . Other   . Watermelon [Citrullus Vulgaris]    Social History   Social History  . Marital status: Single    Spouse name: N/A  . Number of children: N/A  . Years of education: N/A   Occupational History  . Not on file.   Social History Main Topics  . Smoking status: Never Smoker  . Smokeless tobacco: Former NeurosurgeonUser    Types: Chew    Quit date: 05/06/2012  . Alcohol use No  . Drug use: No  . Sexual activity: Not on file   Other Topics Concern  . Not on file   Social History Narrative  . No narrative on file     Review of Systems  All other systems reviewed and are negative.      Objective:   Physical Exam  HENT:  Right Ear: External ear normal.  Left Ear: External ear normal.  Nose: Nose normal.  Mouth/Throat: Posterior oropharyngeal edema and posterior oropharyngeal erythema present. No oropharyngeal exudate or tonsillar abscesses.  Eyes: Conjunctivae are normal.  Neck: Neck supple.  Cardiovascular: Normal rate, regular rhythm and normal heart sounds.   Pulmonary/Chest: Effort normal and  breath sounds normal.  Lymphadenopathy:    He has cervical adenopathy.  Vitals reviewed.         Assessment & Plan:  Sore throat - Plan: STREP GROUP A AG, W/REFLEX TO CULT  Clinically the patient has strep throat. I'll treat with amoxicillin 875 mg by mouth twice a day for 10 days.

## 2016-05-15 LAB — CULTURE, GROUP A STREP: Organism ID, Bacteria: NORMAL

## 2016-05-21 ENCOUNTER — Ambulatory Visit: Payer: PRIVATE HEALTH INSURANCE | Admitting: Family Medicine

## 2016-12-15 ENCOUNTER — Encounter: Payer: PRIVATE HEALTH INSURANCE | Admitting: Family Medicine

## 2016-12-16 ENCOUNTER — Encounter: Payer: PRIVATE HEALTH INSURANCE | Admitting: Family Medicine

## 2016-12-17 ENCOUNTER — Ambulatory Visit: Payer: Self-pay

## 2016-12-17 ENCOUNTER — Other Ambulatory Visit: Payer: Self-pay | Admitting: Occupational Medicine

## 2016-12-17 DIAGNOSIS — R1032 Left lower quadrant pain: Secondary | ICD-10-CM

## 2016-12-22 ENCOUNTER — Ambulatory Visit (INDEPENDENT_AMBULATORY_CARE_PROVIDER_SITE_OTHER): Payer: PRIVATE HEALTH INSURANCE | Admitting: Family Medicine

## 2016-12-22 ENCOUNTER — Encounter: Payer: Self-pay | Admitting: Family Medicine

## 2016-12-22 VITALS — BP 126/80 | HR 64 | Temp 98.1°F | Resp 14 | Ht 68.5 in | Wt 183.0 lb

## 2016-12-22 DIAGNOSIS — Z0000 Encounter for general adult medical examination without abnormal findings: Secondary | ICD-10-CM | POA: Diagnosis not present
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
## 2017-01-26 ENCOUNTER — Encounter: Payer: Self-pay | Admitting: Family Medicine

## 2017-01-26 ENCOUNTER — Ambulatory Visit (INDEPENDENT_AMBULATORY_CARE_PROVIDER_SITE_OTHER): Payer: PRIVATE HEALTH INSURANCE | Admitting: Family Medicine

## 2017-01-26 VITALS — BP 106/78 | HR 60 | Temp 98.4°F | Resp 18 | Wt 180.4 lb

## 2017-01-26 DIAGNOSIS — H6591 Unspecified nonsuppurative otitis media, right ear: Secondary | ICD-10-CM

## 2017-01-26 DIAGNOSIS — J019 Acute sinusitis, unspecified: Secondary | ICD-10-CM | POA: Diagnosis not present

## 2017-01-26 MED ORDER — FLUTICASONE PROPIONATE 50 MCG/ACT NA SUSP: 2.0000 | Freq: Every day | NASAL | 1 refills | Status: DC

## 2017-01-26 MED ORDER — AMOXICILLIN 875 MG PO TABS: 875.0000 mg | ORAL_TABLET | Freq: Two times a day (BID) | ORAL | 0 refills | Status: DC

## 2017-01-26 NOTE — Progress Notes (Signed)
   Subjective:    Patient ID: Pedro Douglas, male    DOB: 12/11/1985, 31 y.o.   MRN: [id number]  Patient presents for Sinusitis (rt ear achy, very congested)  Recurrent sinus, using saline/netty pot, OTC decongestant, some mpost nasal drip, thick discharge.  Non smoker  No cough  No fever   Right ear pain started last night, but has some  Called [facility] with his diagnosed with URI    Review Of Systems:  GEN- denies fatigue, fever, weight loss,weakness, recent illness HEENT- denies eye drainage, change in vision, nasal discharge, CVS- denies chest pain, palpitations RESP- denies SOB, cough, wheeze ABD- denies N/V, change in stools, abd pain GU- denies dysuria, hematuria, dribbling, incontinence MSK- denies joint pain, muscle aches, injury Neuro- denies headache, dizziness, syncope, seizure activity       Objective:    BP 106/78 (BP Location: Right Arm, Patient Position: Sitting, Cuff Size: Normal)   Pulse 60   Temp 98.4 F (36.9 C) (Oral)   Resp 18   Wt 180 lb 6.4 oz (81.8 kg)   BMI 27.03 kg/m  GEN- NAD, alert and oriented x3 HEENT- PERRL, EOMI, non injected sclera, pink conjunctiva, MMM, oropharynx clear , RightTM erythema, with effusion mild bulge, left TM clear, canal clear  + mild maxillary sinus tenderness, inflammed turbinates,  Nasal drainage  Neck- Supple, + shotty  LAD CVS- RRR, no murmur RESP-CTAB EXT- No edema Pulses- Radial 2+        Assessment & Plan:      Problem List Items Addressed This Visit    None    Visit Diagnoses    OME (otitis media with effusion), right    -  Primary   Treat with amoxicillin, flonase, nasal saline, continue decongestant as needed   Relevant Medications   amoxicillin (AMOXIL) 875 MG tablet   Acute rhinosinusitis       Relevant Medications   amoxicillin (AMOXIL) 875 MG tablet   fluticasone (FLONASE) 50 MCG/ACT nasal spray      Note: This dictation was prepared with Dragon dictation along with smaller phrase  technology. Any transcriptional errors that result from this process are unintentional.

## 2017-01-26 NOTE — Patient Instructions (Signed)
Take antibiotics as prescribed Use flonase/nasal saline  Ibuprofen for pain  F/U as needed

## 2017-03-20 ENCOUNTER — Ambulatory Visit: Payer: PRIVATE HEALTH INSURANCE | Admitting: Family Medicine

## 2018-01-07 ENCOUNTER — Ambulatory Visit (INDEPENDENT_AMBULATORY_CARE_PROVIDER_SITE_OTHER): Payer: PRIVATE HEALTH INSURANCE | Admitting: Family Medicine

## 2018-01-07 ENCOUNTER — Encounter: Payer: Self-pay | Admitting: Family Medicine

## 2018-01-07 VITALS — BP 112/72 | HR 70 | Temp 98.1°F | Resp 14 | Ht 68.5 in | Wt 188.0 lb

## 2018-01-07 DIAGNOSIS — Z0000 Encounter for general adult medical examination without abnormal findings: Secondary | ICD-10-CM

## 2018-01-07 DIAGNOSIS — Z114 Encounter for screening for human immunodeficiency virus [HIV]: Secondary | ICD-10-CM

## 2018-01-07 DIAGNOSIS — E785 Hyperlipidemia, unspecified: Secondary | ICD-10-CM | POA: Insufficient documentation
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
## 2018-06-22 ENCOUNTER — Ambulatory Visit: Payer: PRIVATE HEALTH INSURANCE | Admitting: Family Medicine

## 2018-07-07 ENCOUNTER — Encounter: Payer: Self-pay | Admitting: Family Medicine

## 2018-11-16 ENCOUNTER — Other Ambulatory Visit: Payer: Self-pay | Admitting: Family Medicine

## 2018-11-16 MED ORDER — FLUTICASONE PROPIONATE 50 MCG/ACT NA SUSP: 2.0000 | Freq: Every day | NASAL | 11 refills | Status: DC

## 2019-02-04 ENCOUNTER — Other Ambulatory Visit: Payer: Self-pay | Admitting: *Deleted

## 2019-02-04 DIAGNOSIS — Z20822 Contact with and (suspected) exposure to covid-19: Secondary | ICD-10-CM

## 2019-02-06 LAB — NOVEL CORONAVIRUS, NAA: SARS-CoV-2, NAA: NOT DETECTED

## 2019-03-07 ENCOUNTER — Other Ambulatory Visit: Payer: Self-pay

## 2019-03-08 ENCOUNTER — Ambulatory Visit (INDEPENDENT_AMBULATORY_CARE_PROVIDER_SITE_OTHER): Payer: PRIVATE HEALTH INSURANCE | Admitting: Family Medicine

## 2019-03-08 ENCOUNTER — Encounter: Payer: Self-pay | Admitting: Family Medicine

## 2019-03-08 VITALS — BP 130/98 | HR 70 | Temp 97.9°F | Resp 14 | Ht 68.5 in | Wt 202.0 lb

## 2019-03-08 DIAGNOSIS — R635 Abnormal weight gain: Secondary | ICD-10-CM

## 2019-03-08 DIAGNOSIS — Z0000 Encounter for general adult medical examination without abnormal findings: Secondary | ICD-10-CM

## 2019-03-08 DIAGNOSIS — Z0001 Encounter for general adult medical examination with abnormal findings: Secondary | ICD-10-CM
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
## 2019-03-09 LAB — COMPLETE METABOLIC PANEL WITH GFR
AG Ratio: 1.4 (calc) (ref 1.0–2.5)
ALT: 44 U/L (ref 9–46)
AST: 22 U/L (ref 10–40)
Albumin: 4.4 g/dL (ref 3.6–5.1)
Alkaline phosphatase (APISO): 88 U/L (ref 36–130)
BUN: 13 mg/dL (ref 7–25)
CO2: 26 mmol/L (ref 20–32)
Calcium: 9.7 mg/dL (ref 8.6–10.3)
Chloride: 104 mmol/L (ref 98–110)
Creat: 1.14 mg/dL (ref 0.60–1.35)
GFR, Est African American: 99 mL/min/{1.73_m2} (ref >=60)
GFR, Est Non African American: 85 mL/min/{1.73_m2} (ref >=60)
Globulin: 3.2 g/dL (calc) (ref 1.9–3.7)
Glucose, Bld: 84 mg/dL (ref 65–99)
Potassium: 4.7 mmol/L (ref 3.5–5.3)
Sodium: 139 mmol/L (ref 135–146)
Total Bilirubin: 0.6 mg/dL (ref 0.2–1.2)
Total Protein: 7.6 g/dL (ref 6.1–8.1)

## 2019-03-09 LAB — CBC WITH DIFFERENTIAL/PLATELET
Absolute Monocytes: 844 cells/uL (ref 200–950)
Basophils Absolute: 38 cells/uL (ref 0–200)
Basophils Relative: 0.5 %
Eosinophils Absolute: 137 cells/uL (ref 15–500)
Eosinophils Relative: 1.8 %
HCT: 48.2 % (ref 38.5–50.0)
Hemoglobin: 16 g/dL (ref 13.2–17.1)
Lymphs Abs: 2622 cells/uL (ref 850–3900)
MCH: 28.3 pg (ref 27.0–33.0)
MCHC: 33.2 g/dL (ref 32.0–36.0)
MCV: 85.2 fL (ref 80.0–100.0)
MPV: 10.5 fL (ref 7.5–12.5)
Monocytes Relative: 11.1 %
Neutro Abs: 3960 cells/uL (ref 1500–7800)
Neutrophils Relative %: 52.1 %
Platelets: 227 10*3/uL (ref 140–400)
RBC: 5.66 10*6/uL (ref 4.20–5.80)
RDW: 12.3 % (ref 11.0–15.0)
Total Lymphocyte: 34.5 %
WBC: 7.6 10*3/uL (ref 3.8–10.8)

## 2019-03-09 LAB — LIPID PANEL
Cholesterol: 242 mg/dL — ABNORMAL HIGH (ref <200)
HDL: 33 mg/dL — ABNORMAL LOW (ref >=40)
LDL Cholesterol (Calc): 172 mg/dL (calc) — ABNORMAL HIGH
Non-HDL Cholesterol (Calc): 209 mg/dL (calc) — ABNORMAL HIGH (ref <130)
Total CHOL/HDL Ratio: 7.3 (calc) — ABNORMAL HIGH (ref <5.0)
Triglycerides: 201 mg/dL — ABNORMAL HIGH (ref <150)

## 2019-03-09 LAB — TSH: TSH: 3.48 mIU/L (ref 0.40–4.50)

## 2019-05-16 ENCOUNTER — Ambulatory Visit: Payer: PRIVATE HEALTH INSURANCE | Attending: Internal Medicine

## 2019-12-09 ENCOUNTER — Other Ambulatory Visit: Payer: Self-pay

## 2019-12-09 ENCOUNTER — Ambulatory Visit (INDEPENDENT_AMBULATORY_CARE_PROVIDER_SITE_OTHER): Payer: PRIVATE HEALTH INSURANCE | Admitting: Family Medicine

## 2019-12-09 ENCOUNTER — Ambulatory Visit (HOSPITAL_COMMUNITY)
Admission: RE | Admit: 2019-12-09 | Discharge: 2019-12-09 | Disposition: A | Discharge status: Home / Self Care | Payer: PRIVATE HEALTH INSURANCE | Source: Ambulatory Visit | Attending: Family Medicine | Admitting: Family Medicine

## 2019-12-09 VITALS — BP 120/80 | HR 73 | Temp 96.4°F | Ht 68.0 in | Wt 198.0 lb

## 2019-12-09 DIAGNOSIS — M545 Low back pain, unspecified: Secondary | ICD-10-CM

## 2019-12-09 MED ORDER — METHOCARBAMOL 750 MG PO TABS: 750.0000 mg | ORAL_TABLET | Freq: Four times a day (QID) | ORAL | 1 refills | Status: DC | PRN

## 2019-12-09 MED ORDER — PREDNISONE 20 MG PO TABS: ORAL_TABLET | ORAL | 0 refills | Status: DC

## 2019-12-09 NOTE — Progress Notes (Signed)
Subjective:    Patient ID: Pedro Douglas, male    DOB: 1985-12-12, 34 y.o.   MRN: [id number]  HPI Symptoms began Monday night.  Symptoms include sudden onset of sharp lower back pain around the level of L4-L5.  It starts in the center of his back and radiates like a band to either side.  He states that the pain was so intense it dropped into a knee.  He went to an urgent care where he was diagnosed with sciatica and was started on what sounds like prednisone and Flexeril.  He is seen no benefit.  Today he demonstrates an antalgic gait.  He has a difficult time rising to a standing position from his chair due to pain and tightness in his back.  He denies any weakness or numbness radiating down his legs.  He denies any burning paresthesias radiating down his legs.  He denies any bowel or bladder incontinence or saddle anesthesia.  He has normal reflexes.  Muscle strength is 5/5 equal and symmetric in both legs.  He has negative straight leg raise.   Past Medical History:  Diagnosis Date  . Dyslipidemia    Past Surgical History:  Procedure Laterality Date  . TYMPANOSTOMY TUBE PLACEMENT Bilateral 1990   Current Outpatient Medications on File Prior to Visit  Medication Sig Dispense Refill  . fluticasone (FLONASE) 50 MCG/ACT nasal spray Place 2 sprays into both nostrils daily. 16 g 11   No current facility-administered medications on file prior to visit.   Allergies  Allergen Reactions  . Banana   . Other   . Watermelon [Citrullus Vulgaris]    Social History   Socioeconomic History  . Marital status: Married    Spouse name: Not on file  . Number of children: Not on file  . Years of education: Not on file  . Highest education level: Not on file  Occupational History  . Not on file  Tobacco Use  . Smoking status: Never Smoker  . Smokeless tobacco: Former Neurosurgeon    Types: Chew  Substance and Sexual Activity  . Alcohol use: No  . Drug use: No  . Sexual activity: Not on file    Other Topics Concern  . Not on file  Social History Narrative  . Not on file   Social Determinants of Health   Financial Resource Strain:   . Difficulty of Paying Living Expenses:   Food Insecurity:   . Worried About Programme researcher, broadcasting/film/video in the Last Year:   . Barista in the Last Year:   Transportation Needs:   . Freight forwarder (Medical):   Marland Kitchen Lack of Transportation (Non-Medical):   Physical Activity:   . Days of Exercise per Week:   . Minutes of Exercise per Session:   Stress:   . Feeling of Stress :   Social Connections:   . Frequency of Communication with Friends and Family:   . Frequency of Social Gatherings with Friends and Family:   . Attends Religious Services:   . Active Member of Clubs or Organizations:   . Attends Banker Meetings:   Marland Kitchen Marital Status:   Intimate Partner Violence:   . Fear of Current or Ex-Partner:   . Emotionally Abused:   Marland Kitchen Physically Abused:   . Sexually Abused:      Review of Systems  All other systems reviewed and are negative.      Objective:   Physical Exam Vitals reviewed.  HENT:  Mouth/Throat:     Tonsils: No tonsillar abscesses.  Cardiovascular:     Rate and Rhythm: Normal rate and regular rhythm.     Heart sounds: Normal heart sounds.  Pulmonary:     Effort: Pulmonary effort is normal.     Breath sounds: Normal breath sounds.  Musculoskeletal:     Cervical back: Neck supple.     Lumbar back: Spasms and tenderness present. No swelling, deformity, signs of trauma, lacerations or bony tenderness. Normal range of motion. Negative right straight leg raise test and negative left straight leg raise test. No scoliosis.       Back:  Lymphadenopathy:     Cervical: No cervical adenopathy.           Assessment & Plan:  Acute midline low back pain without sciatica - Plan: DG Lumbar Spine Complete, predniSONE (DELTASONE) 20 MG tablet, methocarbamol (ROBAXIN-750) 750 MG tablet  I suspect the  patient has either pulled a muscle in his lower back or may have a herniated disc although he has no lumbar radiculopathy.  Begin a prednisone taper pack and switch his muscle relaxer to Robaxin 750 p.o. every 6 hours as needed pain.  Obtain an x-ray given the unusual history of present illness to rule out any fractures or skeletal pathology.  Recommended no lifting for the next 5 days until pain hopefully improves.  Patient can return to work on Wednesday if his pain is starting to improve

## 2019-12-15 ENCOUNTER — Telehealth: Payer: Self-pay | Admitting: Family Medicine

## 2019-12-15 NOTE — Telephone Encounter (Signed)
CB# [phone] Pt was seen for back pain prescribe Prednisone and Robaxin not helping is there another medication prescribe for his pain

## 2019-12-16 ENCOUNTER — Other Ambulatory Visit: Payer: Self-pay

## 2019-12-16 MED ORDER — CELECOXIB 400 MG PO CAPS: 400.0000 mg | ORAL_CAPSULE | Freq: Two times a day (BID) | ORAL | 3 refills | Status: DC

## 2019-12-16 MED ORDER — CELECOXIB 200 MG PO CAPS: 200.0000 mg | ORAL_CAPSULE | Freq: Two times a day (BID) | ORAL | 3 refills | Status: DC

## 2019-12-16 NOTE — Telephone Encounter (Signed)
Would recommend a trial of PT while switching to celebrex 200 mg pobid.

## 2019-12-16 NOTE — Telephone Encounter (Signed)
Spoke with Pt he is willing to do Physical Therapy and starting Celebrex 200 mg pobid.

## 2019-12-19 ENCOUNTER — Telehealth: Payer: Self-pay | Admitting: Family Medicine

## 2019-12-19 NOTE — Telephone Encounter (Signed)
CB# [phone] Drop off FMLA papers 12-19-19. Forms in sign off folder

## 2019-12-20 NOTE — Telephone Encounter (Signed)
Takes 7 to 10 days for forms to be completed

## 2019-12-23 ENCOUNTER — Other Ambulatory Visit: Payer: Self-pay | Admitting: *Deleted

## 2019-12-23 ENCOUNTER — Other Ambulatory Visit: Payer: Self-pay

## 2019-12-23 DIAGNOSIS — M545 Low back pain, unspecified: Secondary | ICD-10-CM

## 2019-12-23 MED ORDER — CELECOXIB 200 MG PO CAPS: 200.0000 mg | ORAL_CAPSULE | Freq: Two times a day (BID) | ORAL | 3 refills | Status: DC

## 2019-12-26 ENCOUNTER — Telehealth (HOSPITAL_COMMUNITY): Payer: Self-pay | Admitting: Speech Pathology

## 2019-12-26 NOTE — Telephone Encounter (Signed)
pt mother cancelled appt for today no reason given

## 2020-01-05 ENCOUNTER — Ambulatory Visit (HOSPITAL_COMMUNITY): Payer: PRIVATE HEALTH INSURANCE | Attending: Family Medicine | Admitting: Physical Therapy

## 2020-01-05 ENCOUNTER — Encounter (HOSPITAL_COMMUNITY): Payer: Self-pay | Admitting: Physical Therapy

## 2020-01-05 ENCOUNTER — Other Ambulatory Visit: Payer: Self-pay

## 2020-01-05 DIAGNOSIS — R2689 Other abnormalities of gait and mobility: Secondary | ICD-10-CM | POA: Diagnosis present

## 2020-01-05 DIAGNOSIS — M6281 Muscle weakness (generalized): Secondary | ICD-10-CM | POA: Insufficient documentation

## 2020-01-05 DIAGNOSIS — M545 Low back pain, unspecified: Secondary | ICD-10-CM

## 2020-01-05 DIAGNOSIS — R29898 Other symptoms and signs involving the musculoskeletal system: Secondary | ICD-10-CM | POA: Diagnosis present

## 2020-01-05 NOTE — Patient Instructions (Signed)
Access Code: [email or web address] URL: [email or web address] Date: 01/05/2020 Prepared by: [employer] Zalma Channing  Exercises [employer] Up - 5 x daily - 7 x weekly - 2 sets - 10 reps - 2 second hold

## 2020-01-05 NOTE — Therapy (Signed)
[facility] [address] [place], [place], [address] Phone: [phone]   Fax:  [phone]  Physical Therapy Evaluation  Patient Details  Name: Pedro Douglas MRN: [id number] Date of Birth: [birth year] Referring Provider (PT): Lynnea Ferrier MD   Encounter Date: 01/05/2020   PT End of Session - 01/05/20 1254    Visit Number 1    Number of Visits 8    Date for PT Re-Evaluation 02/02/20    Authorization Type [employer] (V.L 60, no auth)    Authorization - Visit Number 1    Authorization - Number of Visits 60    PT Start Time 0906    PT Stop Time 0945    PT Time Calculation (min) 39 min    Activity Tolerance Patient tolerated treatment well    Behavior During Therapy [facility] for tasks assessed/performed           Past Medical History:  Diagnosis Date  . Dyslipidemia     Past Surgical History:  Procedure Laterality Date  . TYMPANOSTOMY TUBE PLACEMENT Bilateral 1990    There were no vitals filed for this visit.    Subjective Assessment - 01/05/20 0908    Subjective Patient is a 34 y.o. male who presents to physical therapy with c/o low back pain. Patient states pain started on July 26th and it just came on while he was standing. He went to urgent care and they gave meds. Time has made it a little better. He could hardly walk. Aggravating factors include bending, certain sustained positions. Rest decreases pain. He states periodic symptoms into upper glutes. Denies changes in bowel/bladder function, no numbness tingling in the groin region. He has history of low back pain which has always resolved.    Limitations Lifting;House hold activities    Patient Stated Goals get rid of back pain    Currently in Pain? Yes    Pain Score 5    4-5/10 at worst   Pain Location Back    Pain Orientation Lower              [facility] PT Assessment - 01/05/20 0001      Assessment   Medical Diagnosis LBP    Referring Provider (PT) Lynnea Ferrier MD     Onset Date/Surgical Date 11/28/19    Next MD Visit None scheduled    Prior Therapy shoulder      Precautions   Precautions None      Restrictions   Weight Bearing Restrictions No      Balance Screen   Has the patient fallen in the past 6 months No    Has the patient had a decrease in activity level because of a fear of falling?  No    Is the patient reluctant to leave their home because of a fear of falling?  No      Prior Function   Level of Independence Independent    Vocation Full time employment    Printmaker   Overall Cognitive Status Within Functional Limits for tasks assessed      Observation/Other Assessments   Observations Ambulates without AD, slouched in seated    Focus on Therapeutic Outcomes (FOTO)  39% limited      Sensation   Light Touch Appears Intact      ROM / Strength   AROM / PROM / Strength AROM;Strength      AROM   AROM Assessment Site  Lumbar    Lumbar Flexion 50% limited, pain    Lumbar Extension 50% limited, pain    Lumbar - Right Side Bend 25% limited    Lumbar - Left Side Bend 25% limited    Lumbar - Right Rotation 0% limited     Lumbar - Left Rotation 0% limited      Strength   Overall Strength Comments slight increase in low back pain with knee flexion    Strength Assessment Site Hip;Knee;Ankle    Right/Left Hip Right;Left    Right Hip Flexion 5/5    Left Hip Flexion 5/5    Right/Left Knee Right;Left    Right Knee Flexion 5/5    Right Knee Extension 5/5    Left Knee Flexion 5/5    Left Knee Extension 5/5    Right/Left Ankle Right;Left    Right Ankle Dorsiflexion 5/5    Left Ankle Dorsiflexion 5/5      Palpation   Spinal mobility grossly hypomobile thoracic and lumbar spine    Palpation comment hyperactive thoracic and lumbar paraspinals with greatest tenderness at L4-L5 bilateral, TTP bilateral PSIS                      Objective measurements completed on examination: See  above findings.       [facility] Adult PT Treatment/Exercise - 01/05/20 0001      Exercises   Exercises Lumbar      Lumbar Exercises: Stretches   Prone on Elbows Stretch 3 reps;30 seconds    Press Ups 10 reps    Press Ups Limitations 3 second holds                  PT Education - 01/05/20 [id number]    Education Details Patient educated on exam findings, POC, scope of PT, initial HEP    Person(s) Educated Patient    Methods Explanation;Demonstration;Handout    Comprehension Verbalized understanding;Returned demonstration            PT Short Term Goals - 01/05/20 1302      PT SHORT TERM GOAL #1   Title Patient will be independent with HEP in order to improve functional outcomes.    Time 2    Period Weeks    Status New    Target Date 01/19/20      PT SHORT TERM GOAL #2   Title Patient will report at least 25% improvement in symptoms for improved quality of life.    Time 2    Period Weeks    Status New    Target Date 01/19/20             PT Long Term Goals - 01/05/20 1302      PT LONG TERM GOAL #1   Title Patient will report at least 75% improvement in symptoms for improved quality of life.    Time 4    Period Weeks    Status New    Target Date 02/02/20      PT LONG TERM GOAL #2   Title Patient will improve FOTO score by at least 10 points in order to indicate improved tolerance to activity.    Time 4    Period Weeks    Status New    Target Date 02/02/20      PT LONG TERM GOAL #3   Title Patient will demonstrate at least 25% improvement in lumbar ROM in all planes for improved ability to move trunk.  Time 4    Period Weeks    Status New    Target Date 02/02/20      PT LONG TERM GOAL #4   Title Patient will be able to return to ADL unrestricted so patient can return to work at full duty.    Time 4    Period Weeks    Status New    Target Date 02/02/20                  Plan - 01/05/20 1255    Clinical Impression Statement Patient is a  34 y.o. male who presents to physical therapy with c/o low back pain. He presents with pain limited deficits in lumbar strength, ROM, endurance, postural impairments, spinal mobility and functional mobility with ADL. He is having to modify and restrict ADL as indicated by FOTO score as well as subjective information and objective measures which is affecting overall participation. Patient states possible positive effects from completing press up exercise. Patient will benefit from skilled physical therapy in order to improve function and reduce impairment.    Personal Factors and Comorbidities Comorbidity 1;Fitness;Profession    Comorbidities hx back pain    Examination-Activity Limitations Bend;Carry;Lift;Locomotion Level;Squat;Stairs;Stand;Transfers    Examination-Participation Restrictions Cleaning;Meal Prep;Occupation;Laundry;Shop;Volunteer;Yard Work    Stability/Clinical Decision Making Stable/Uncomplicated    Optometrist Low    Rehab Potential Good    PT Frequency 2x / week    PT Duration 4 weeks    PT Treatment/Interventions ADLs/Self Care Home Management;Aquatic Therapy;Cryotherapy;Electrical Stimulation;Iontophoresis 4mg /ml Dexamethasone;Moist Heat;Traction;Ultrasound;DME Instruction;Gait training;Stair training;Functional mobility training;Therapeutic activities;Therapeutic exercise;Balance training;Neuromuscular re-education;Patient/family education;Orthotic Fit/Training;Manual techniques;Manual lymph drainage;Passive range of motion;Dry needling;Energy conservation;Taping;Splinting;Spinal Manipulations;Joint Manipulations    PT Next Visit Plan f/u on press up, possibly begin manual with mobs/stm for pain/mobility, possibly further assess hip strength, begin core strengthing and lumbar mobility exercises    PT Home Exercise Plan 9/2 Prone press up    Consulted and Agree with Plan of Care Patient           Patient will benefit from skilled therapeutic intervention in order to  improve the following deficits and impairments:  Decreased range of motion, Decreased endurance, Decreased activity tolerance, Pain, Hypomobility, Impaired flexibility, Improper body mechanics, Decreased strength, Postural dysfunction, Decreased mobility  Visit Diagnosis: Low back pain, unspecified back pain laterality, unspecified chronicity, unspecified whether sciatica present  Muscle weakness (generalized)  Other abnormalities of gait and mobility  Other symptoms and signs involving the musculoskeletal system     Problem List Patient Active Problem List   Diagnosis Date Noted  . Dyslipidemia     1:31 PM, 01/05/20 03/06/20 PT, DPT Physical Therapist at [facility]  [place] [facility] [address] [place], [place], [place] Phone: [phone]   Fax:  [phone]  Name: RACHEL SAMPLES MRN: Rainey Pines Date of Birth: 1985/10/06

## 2020-01-10 ENCOUNTER — Encounter (HOSPITAL_COMMUNITY): Payer: Self-pay | Admitting: Physical Therapy

## 2020-01-10 ENCOUNTER — Ambulatory Visit (HOSPITAL_COMMUNITY): Payer: PRIVATE HEALTH INSURANCE | Admitting: Physical Therapy

## 2020-01-10 ENCOUNTER — Other Ambulatory Visit: Payer: Self-pay

## 2020-01-10 DIAGNOSIS — M545 Low back pain, unspecified: Secondary | ICD-10-CM

## 2020-01-10 DIAGNOSIS — M6281 Muscle weakness (generalized): Secondary | ICD-10-CM

## 2020-01-10 DIAGNOSIS — R2689 Other abnormalities of gait and mobility: Secondary | ICD-10-CM

## 2020-01-10 DIAGNOSIS — R29898 Other symptoms and signs involving the musculoskeletal system: Secondary | ICD-10-CM

## 2020-01-10 NOTE — Patient Instructions (Signed)
Access Code: [email or web address] URL: [email or web address] Date: 01/10/2020 Prepared by: Greig Castilla Elany Felix  Exercises Prone Hip Extension - 1 x daily - 7 x weekly - 2 sets - 10 reps - 5 hold Abdominal Bracing - 1 x daily - 7 x weekly - 10 reps - 5 second hold

## 2020-01-10 NOTE — Therapy (Signed)
[place] [facility] [address] [place], [place], [address] Phone: [phone]   Fax:  [phone]  Physical Therapy Treatment  Patient Details  Name: Pedro Douglas MRN: [id number] Date of Birth: 19-May-1985 Referring Provider (PT): Lynnea Ferrier MD   Encounter Date: 01/10/2020   PT End of Session - 01/10/20 1518    Visit Number 2    Number of Visits 8    Date for PT Re-Evaluation 02/02/20    Authorization Type [employer] (V.L 60, no auth)    Authorization - Visit Number 2    Authorization - Number of Visits 60    PT Start Time 1520    PT Stop Time 1600    PT Time Calculation (min) 40 min    Activity Tolerance Patient tolerated treatment well    Behavior During Therapy [facility] for tasks assessed/performed           Past Medical History:  Diagnosis Date  . Dyslipidemia     Past Surgical History:  Procedure Laterality Date  . TYMPANOSTOMY TUBE PLACEMENT Bilateral 1990    There were no vitals filed for this visit.   Subjective Assessment - 01/10/20 1518    Subjective Patient reports he has been doing his exercises. He was feeling good over the weekend and today is sore. 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 He was not having much pain/stiffness during those days.    Limitations Lifting;House hold activities    Patient Stated Goals get rid of back pain    Pain Score 5     Pain Location Back    Pain Orientation Lower              [facility] PT Assessment - 01/10/20 0001      Strength   Right Hip Extension 5/5    Right Hip ABduction 5/5    Left Hip Extension 5/5    Left Hip ABduction 5/5                         [facility] Adult PT Treatment/Exercise - 01/10/20 0001      Lumbar Exercises: Stretches   Standing Extension 10 reps    Prone on Elbows Stretch 5 reps;10 seconds    Press Ups 10 reps    Press Ups Limitations 3 second holds; 2 sets      Lumbar Exercises: Supine    Ab Set 10 reps;5 seconds      Lumbar Exercises: Prone   Straight Leg Raise 10 reps;5 seconds    Straight Leg Raises Limitations bilateral       Manual Therapy   Manual Therapy Soft tissue mobilization;Joint mobilization    Manual therapy comments completed independently from all other aspects of treatment    Joint Mobilization L and R UPA Grade II-III    Soft tissue mobilization lumbar paraspinals                   PT Education - 01/10/20 1518    Education Details Patient educated on HEP, mechanics of exercise, lumbar anatomy and pathology.    Person(s) Educated Patient    Methods Explanation;Demonstration    Comprehension Verbalized understanding;Returned demonstration            PT Short Term Goals - 01/05/20 1302      PT SHORT TERM GOAL #1   Title Patient will be independent with HEP in order to improve  functional outcomes.    Time 2    Period Weeks    Status New    Target Date 01/19/20      PT SHORT TERM GOAL #2   Title Patient will report at least 25% improvement in symptoms for improved quality of life.    Time 2    Period Weeks    Status New    Target Date 01/19/20             PT Long Term Goals - 01/05/20 1302      PT LONG TERM GOAL #1   Title Patient will report at least 75% improvement in symptoms for improved quality of life.    Time 4    Period Weeks    Status New    Target Date 02/02/20      PT LONG TERM GOAL #2   Title Patient will improve FOTO score by at least 10 points in order to indicate improved tolerance to activity.    Time 4    Period Weeks    Status New    Target Date 02/02/20      PT LONG TERM GOAL #3   Title Patient will demonstrate at least 25% improvement in lumbar ROM in all planes for improved ability to move trunk.    Time 4    Period Weeks    Status New    Target Date 02/02/20      PT LONG TERM GOAL #4   Title Patient will be able to return to ADL unrestricted so patient can return to work at full duty.     Time 4    Period Weeks    Status New    Target Date 02/02/20                 Plan - 01/10/20 1519    Clinical Impression Statement Patient has slight improvement in symptoms with prone press up exercise noting decrease in stiffness following. He has no change in symptoms with standing extension but has increase in pulling during. He tolerates manual therapy well with grade II-III mobilizations for pain and mobility with improved flexion ROM following and slight decrease in symptoms. He has greatest benefit from R UPA. He tolerates prone hip extension and ab sets without increase in symptoms.  Patient feeling minimal change in symptoms at end of session. Patient will continue to benefit from skilled physical therapy to reduce impairment and improve function.    Personal Factors and Comorbidities Comorbidity 1;Fitness;Profession    Comorbidities hx back pain    Examination-Activity Limitations Bend;Carry;Lift;Locomotion Level;Squat;Stairs;Stand;Transfers    Examination-Participation Restrictions Cleaning;Meal Prep;Occupation;Laundry;Shop;Volunteer;Yard Work    Stability/Clinical Decision Making Stable/Uncomplicated    Rehab Potential Good    PT Frequency 2x / week    PT Duration 4 weeks    PT Treatment/Interventions ADLs/Self Care Home Management;Aquatic Therapy;Cryotherapy;Electrical Stimulation;Iontophoresis 4mg /ml Dexamethasone;Moist Heat;Traction;Ultrasound;DME Instruction;Gait training;Stair training;Functional mobility training;Therapeutic activities;Therapeutic exercise;Balance training;Neuromuscular re-education;Patient/family education;Orthotic Fit/Training;Manual techniques;Manual lymph drainage;Passive range of motion;Dry needling;Energy conservation;Taping;Splinting;Spinal Manipulations;Joint Manipulations    PT Next Visit Plan possilby continue manual with mobs/stm for pain/mobility, continue core strengthing and lumbar mobility exercises    PT Home Exercise Plan 9/2 Prone  press up 9/7 hip ext, ab set    Consulted and Agree with Plan of Care Patient           Patient will benefit from skilled therapeutic intervention in order to improve the following deficits and impairments:  Decreased range of motion, Decreased endurance, Decreased activity tolerance,  Pain, Hypomobility, Impaired flexibility, Improper body mechanics, Decreased strength, Postural dysfunction, Decreased mobility  Visit Diagnosis: Low back pain, unspecified back pain laterality, unspecified chronicity, unspecified whether sciatica present  Muscle weakness (generalized)  Other abnormalities of gait and mobility  Other symptoms and signs involving the musculoskeletal system     Problem List Patient Active Problem List   Diagnosis Date Noted  . Dyslipidemia     4:07 PM, 01/10/20 Wyman Songster PT, DPT Physical Therapist at [facility]  [place] [facility] [address] [place], [place], [address] Phone: [phone]   Fax:  [phone]  Name: Pedro Douglas MRN: [id number] Date of Birth: 27-Mar-1986

## 2020-01-12 ENCOUNTER — Other Ambulatory Visit: Payer: Self-pay

## 2020-01-12 ENCOUNTER — Encounter (HOSPITAL_COMMUNITY): Payer: Self-pay | Admitting: Physical Therapy

## 2020-01-12 ENCOUNTER — Ambulatory Visit (HOSPITAL_COMMUNITY): Payer: PRIVATE HEALTH INSURANCE | Admitting: Physical Therapy

## 2020-01-12 DIAGNOSIS — R2689 Other abnormalities of gait and mobility: Secondary | ICD-10-CM

## 2020-01-12 DIAGNOSIS — M6281 Muscle weakness (generalized): Secondary | ICD-10-CM

## 2020-01-12 DIAGNOSIS — R29898 Other symptoms and signs involving the musculoskeletal system: Secondary | ICD-10-CM

## 2020-01-12 DIAGNOSIS — M545 Low back pain, unspecified: Secondary | ICD-10-CM

## 2020-01-12 NOTE — Patient Instructions (Signed)
Access Code: [email or web address] URL: [email or web address] Date: 01/12/2020 Prepared by: Greig Castilla Ayman Brull  Exercises Supine Dead Bug with Leg Extension - 1 x daily - 7 x weekly - 2 sets - 10 reps Beginner Front Arm Support - 1 x daily - 7 x weekly - 3 sets - 10 reps Supine Bridge - 1 x daily - 7 x weekly - 2 sets - 10 reps

## 2020-01-12 NOTE — Therapy (Signed)
[facility] [address] [place], [place], [address] Phone: [phone]   Fax:  [phone]  Physical Therapy Treatment  Patient Details  Name: Pedro Douglas MRN: [id number] Date of Birth: [birth year] Referring Provider (PT): Lynnea Ferrier MD   Encounter Date: 01/12/2020   PT End of Session - 01/12/20 1359    Visit Number 3    Number of Visits 8    Date for PT Re-Evaluation 02/02/20    Authorization Type [employer] (V.L 60, no auth)    Authorization - Visit Number 3    Authorization - Number of Visits 60    PT Start Time 1400   arrives late   PT Stop Time 1430    PT Time Calculation (min) 30 min    Activity Tolerance Patient tolerated treatment well    Behavior During Therapy [facility] for tasks assessed/performed           Past Medical History:  Diagnosis Date  . Dyslipidemia     Past Surgical History:  Procedure Laterality Date  . TYMPANOSTOMY TUBE PLACEMENT Bilateral 1990    There were no vitals filed for this visit.   Subjective Assessment - 01/12/20 1358    Subjective Patient states his back has been great today and its one of the better days he had in a long time. Yesterday he was a little sore in the morning and it loosened up as the day went on.    Limitations Lifting;House hold activities    Patient Stated Goals get rid of back pain    Currently in Pain? Yes    Pain Score 1    uncomfortable with movement   Pain Location Back                             [facility] Adult PT Treatment/Exercise - 01/12/20 0001      Lumbar Exercises: Supine   Dead Bug 10 reps    Dead Bug Limitations 3 sets    Bridge 10 reps;3 seconds    Bridge Limitations 2 sets      Lumbar Exercises: Quadruped   Other Quadruped Lumbar Exercises quadruped hip extension 2x 10 bilateral  with manual and tactile cueing      Manual Therapy   Manual Therapy Soft tissue mobilization;Joint mobilization    Manual therapy comments completed  independently from all other aspects of treatment    Joint Mobilization bilateral rotation mobilization in sidelying grade IV                  PT Education - 01/12/20 1358    Education Details Patient educated on HEP, mechanics of exercise    Person(s) Educated Patient    Methods Explanation;Demonstration;Handout    Comprehension Verbalized understanding;Returned demonstration            PT Short Term Goals - 01/12/20 1400      PT SHORT TERM GOAL #1   Title Patient will be independent with HEP in order to improve functional outcomes.    Time 2    Period Weeks    Status On-going    Target Date 01/19/20      PT SHORT TERM GOAL #2   Title Patient will report at least 25% improvement in symptoms for improved quality of life.    Time 2    Period Weeks    Status On-going    Target Date 01/19/20  PT Long Term Goals - 01/12/20 1400      PT LONG TERM GOAL #1   Title Patient will report at least 75% improvement in symptoms for improved quality of life.    Time 4    Period Weeks    Status On-going      PT LONG TERM GOAL #2   Title Patient will improve FOTO score by at least 10 points in order to indicate improved tolerance to activity.    Time 4    Period Weeks    Status On-going      PT LONG TERM GOAL #3   Title Patient will demonstrate at least 25% improvement in lumbar ROM in all planes for improved ability to move trunk.    Time 4    Period Weeks    Status On-going      PT LONG TERM GOAL #4   Title Patient will be able to return to ADL unrestricted so patient can return to work at full duty.    Time 4    Period Weeks    Status New                 Plan - 01/12/20 1359    Clinical Impression Statement Patient experiences improvement in ROM and stiffness following rotational mobilization in sidelying. Patient requires frequent verbal, tactile, and manual cueing with quadruped exercise to limit excessive lumbar ROM. Patient with good  core activation and mechanics with dead bug with LE only. He is able to progress to alternating UE/LE with dead bug exercise. He has no c/o symptoms with bridges with prior glute activation. Patient educated on continuing extension based exercises and being aware of symptom changes before, during, after exercise and throughout day. Patient will continue to benefit from skilled physical therapy in order to reduce impairment and improve function.    Personal Factors and Comorbidities Comorbidity 1;Fitness;Profession    Comorbidities hx back pain    Examination-Activity Limitations Bend;Carry;Lift;Locomotion Level;Squat;Stairs;Stand;Transfers    Examination-Participation Restrictions Cleaning;Meal Prep;Occupation;Laundry;Shop;Volunteer;Yard Work    Stability/Clinical Decision Making Stable/Uncomplicated    Rehab Potential Good    PT Frequency 2x / week    PT Duration 4 weeks    PT Treatment/Interventions ADLs/Self Care Home Management;Aquatic Therapy;Cryotherapy;Electrical Stimulation;Iontophoresis 4mg /ml Dexamethasone;Moist Heat;Traction;Ultrasound;DME Instruction;Gait training;Stair training;Functional mobility training;Therapeutic activities;Therapeutic exercise;Balance training;Neuromuscular re-education;Patient/family education;Orthotic Fit/Training;Manual techniques;Manual lymph drainage;Passive range of motion;Dry needling;Energy conservation;Taping;Splinting;Spinal Manipulations;Joint Manipulations    PT Next Visit Plan possilby continue manual with mobs/stm for pain/mobility, continue core strengthing and lumbar mobility exercises    PT Home Exercise Plan 9/2 Prone press up 9/7 hip ext, ab set 9/9 bridges, quadruped hip extension, dead bug    Consulted and Agree with Plan of Care Patient           Patient will benefit from skilled therapeutic intervention in order to improve the following deficits and impairments:  Decreased range of motion, Decreased endurance, Decreased activity tolerance,  Pain, Hypomobility, Impaired flexibility, Improper body mechanics, Decreased strength, Postural dysfunction, Decreased mobility  Visit Diagnosis: Low back pain, unspecified back pain laterality, unspecified chronicity, unspecified whether sciatica present  Muscle weakness (generalized)  Other abnormalities of gait and mobility  Other symptoms and signs involving the musculoskeletal system     Problem List Patient Active Problem List   Diagnosis Date Noted  . Dyslipidemia     3:29 PM, 01/12/20 03/13/20 PT, DPT Physical Therapist at [facility]  [facility] [address]  [address] [place], [place], [address] Phone: [phone]   Fax:  [phone]  Name: Pedro Douglas MRN: [id number] Date of Birth: [birth year]

## 2020-01-14 ENCOUNTER — Other Ambulatory Visit: Payer: Self-pay | Admitting: Family Medicine

## 2020-01-16 ENCOUNTER — Ambulatory Visit (HOSPITAL_COMMUNITY): Payer: PRIVATE HEALTH INSURANCE

## 2020-01-16 ENCOUNTER — Other Ambulatory Visit: Payer: Self-pay | Admitting: Family Medicine

## 2020-01-16 ENCOUNTER — Telehealth (HOSPITAL_COMMUNITY): Payer: Self-pay

## 2020-01-16 MED ORDER — FLUTICASONE PROPIONATE 50 MCG/ACT NA SUSP: 2.0000 | Freq: Every day | NASAL | 11 refills | Status: AC

## 2020-01-16 NOTE — Telephone Encounter (Signed)
Stated patient considered no show today for 10:30 am appointment and to please call front office if needs to reschedule any existing appointments. Stated patient's next appointment time Wednesday 9/15 at 3:15 pm with Mardelle Matte.

## 2020-01-16 NOTE — Telephone Encounter (Signed)
Prescription sent to pharmacy.

## 2020-01-16 NOTE — Telephone Encounter (Signed)
Refill Fluticasone

## 2020-01-18 ENCOUNTER — Encounter (HOSPITAL_COMMUNITY): Payer: No Typology Code available for payment source | Admitting: Physical Therapy

## 2020-01-20 ENCOUNTER — Telehealth (HOSPITAL_COMMUNITY): Payer: Self-pay | Admitting: Physical Therapy

## 2020-01-20 ENCOUNTER — Telehealth: Payer: Self-pay | Admitting: Family Medicine

## 2020-01-20 NOTE — Telephone Encounter (Signed)
Cb# [phone] Pt was referred to [facility] by Dr.Pickard they need any notes,labs document concern his back any treatment also fas over [phone] to [facility]

## 2020-01-20 NOTE — Telephone Encounter (Signed)
Pt tested positive for Covid today and will quarantine for 14 days - pt will call back to r/s

## 2020-01-23 ENCOUNTER — Encounter (HOSPITAL_COMMUNITY): Payer: No Typology Code available for payment source | Admitting: Physical Therapy

## 2020-01-26 ENCOUNTER — Encounter (HOSPITAL_COMMUNITY): Payer: No Typology Code available for payment source | Admitting: Physical Therapy

## 2020-01-30 ENCOUNTER — Encounter (HOSPITAL_COMMUNITY): Payer: No Typology Code available for payment source | Admitting: Physical Therapy

## 2020-01-31 NOTE — Telephone Encounter (Signed)
I have faxed records to [facility] # [id number].

## 2020-02-01 ENCOUNTER — Encounter (HOSPITAL_COMMUNITY): Payer: No Typology Code available for payment source | Admitting: Physical Therapy

## 2020-03-09 ENCOUNTER — Encounter: Payer: Self-pay | Admitting: Family Medicine

## 2020-03-09 ENCOUNTER — Ambulatory Visit (INDEPENDENT_AMBULATORY_CARE_PROVIDER_SITE_OTHER): Payer: PRIVATE HEALTH INSURANCE | Admitting: Family Medicine

## 2020-03-09 ENCOUNTER — Other Ambulatory Visit: Payer: Self-pay

## 2020-03-09 VITALS — BP 130/90 | HR 71 | Temp 97.9°F | Ht 68.0 in | Wt 197.0 lb

## 2020-03-09 DIAGNOSIS — M5126 Other intervertebral disc displacement, lumbar region: Secondary | ICD-10-CM | POA: Insufficient documentation

## 2020-03-09 DIAGNOSIS — Z23 Encounter for immunization: Secondary | ICD-10-CM | POA: Diagnosis not present

## 2020-03-09 DIAGNOSIS — Z0000 Encounter for general adult medical examination without abnormal findings: Secondary | ICD-10-CM | POA: Diagnosis not present

## 2020-03-09 NOTE — Progress Notes (Signed)
Subjective:    Patient ID: Pedro Douglas, male    DOB: [birth year], 34 y.o.   MRN: [id number]  HPI Patient is a very pleasant 34 year old Caucasian male here today for complete physical exam.  Last year, labs were significant for HLD.  BP this year is elevated at 130/90.   I recently saw the patient for a back injury he suffered at work.  Unfortunately the pain did not improve.  He is now seeing orthopedics.  They performed an MRI that showed degenerative disc disease throughout the lumbar spine but specifically he had a bulging disc at L4-L5.  He received epidural steroid injections earlier this week under the care of Dr. Andria Rhein (sp?).  He has been on light duty since I last saw him.  He is due for a flu shot which he would like to get today.  He is due for a tetanus shot but he declines that at the present time.  He has been checking his blood pressure occasionally.  Earlier this week it was normal when he got a steroid injection.  However he admits that he has not been able to be very active due to his back pain so he is not able to exercise much.  He is not smoking or drinking or using smokeless tobacco.  He is fully vaccinated for Covid.  He denies any family history of premature cardiovascular disease other than his paternal uncle who may have had a heart attack in his 37s or 67s.  Otherwise he denies any other premature cardiovascular disease Past Medical History:  Diagnosis Date  . Dyslipidemia    Past Surgical History:  Procedure Laterality Date  . TYMPANOSTOMY TUBE PLACEMENT Bilateral 1990   Current Outpatient Medications on File Prior to Visit  Medication Sig Dispense Refill  . celecoxib (CELEBREX) 200 MG capsule Take 1 capsule (200 mg total) by mouth in the morning and at bedtime. 30 capsule 3  . fluticasone (FLONASE) 50 MCG/ACT nasal spray Place 2 sprays into both nostrils daily. 16 g 11  . methocarbamol (ROBAXIN-750) 750 MG tablet Take 1 tablet (750 mg total) by mouth every 6  (six) hours as needed for muscle spasms. 30 tablet 1   No current facility-administered medications on file prior to visit.    Allergies  Allergen Reactions  . Banana   . Other   . Watermelon [Citrullus Vulgaris]    Social History   Socioeconomic History  . Marital status: Married    Spouse name: Not on file  . Number of children: Not on file  . Years of education: Not on file  . Highest education level: Not on file  Occupational History  . Not on file  Tobacco Use  . Smoking status: Never Smoker  . Smokeless tobacco: Former Neurosurgeon    Types: Chew  Substance and Sexual Activity  . Alcohol use: No  . Drug use: No  . Sexual activity: Not on file  Other Topics Concern  . Not on file  Social History Narrative  . Not on file   Social Determinants of Health   Financial Resource Strain:   . Difficulty of Paying Living Expenses: Not on file  Food Insecurity:   . Worried About Programme researcher, broadcasting/film/video in the Last Year: Not on file  . Ran Out of Food in the Last Year: Not on file  Transportation Needs:   . Lack of Transportation (Medical): Not on file  . Lack of Transportation (Non-Medical): Not on file  Physical Activity:   . Days of Exercise per Week: Not on file  . Minutes of Exercise per Session: Not on file  Stress:   . Feeling of Stress : Not on file  Social Connections:   . Frequency of Communication with Friends and Family: Not on file  . Frequency of Social Gatherings with Friends and Family: Not on file  . Attends Religious Services: Not on file  . Active Member of Clubs or Organizations: Not on file  . Attends Banker Meetings: Not on file  . Marital Status: Not on file  Intimate Partner Violence:   . Fear of Current or Ex-Partner: Not on file  . Emotionally Abused: Not on file  . Physically Abused: Not on file  . Sexually Abused: Not on file   Family History  Problem Relation Age of Onset  . Cancer Mother        cervical  . Heart disease  Paternal Uncle   . Cancer Maternal Grandmother        cervical  . Diabetes Paternal Grandmother       Review of Systems  All other systems reviewed and are negative.      Objective:   Physical Exam Vitals reviewed.  Constitutional:      General: He is not in acute distress.    Appearance: Normal appearance. He is well-developed. He is obese. He is not ill-appearing, toxic-appearing or diaphoretic.  HENT:     Head: Normocephalic and atraumatic.     Right Ear: Tympanic membrane, ear canal and external ear normal.     Left Ear: Tympanic membrane, ear canal and external ear normal.     Nose: Nose normal. No congestion or rhinorrhea.     Mouth/Throat:     Pharynx: No oropharyngeal exudate.  Eyes:     General: No scleral icterus.       Right eye: No discharge.        Left eye: No discharge.     Extraocular Movements: Extraocular movements intact.     Conjunctiva/sclera: Conjunctivae normal.     Pupils: Pupils are equal, round, and reactive to light.  Neck:     Thyroid: No thyromegaly.     Vascular: No carotid bruit or JVD.     Trachea: No tracheal deviation.  Cardiovascular:     Rate and Rhythm: Normal rate and regular rhythm.     Heart sounds: Normal heart sounds. No murmur heard.  No friction rub. No gallop.   Pulmonary:     Effort: Pulmonary effort is normal. No respiratory distress.     Breath sounds: Normal breath sounds. No stridor. No wheezing, rhonchi or rales.  Chest:     Chest wall: No tenderness.  Abdominal:     General: Bowel sounds are normal. There is no distension.     Palpations: Abdomen is soft. There is no mass.     Tenderness: There is no abdominal tenderness. There is no guarding or rebound.     Hernia: No hernia is present.  Genitourinary:    Penis: Normal.      Testes: Normal.  Musculoskeletal:        General: No tenderness. Normal range of motion.     Cervical back: Normal range of motion and neck supple. No rigidity.     Right lower leg: No  edema.     Left lower leg: No edema.  Lymphadenopathy:     Cervical: No cervical adenopathy.  Skin:    General:  Skin is warm.     Coloration: Skin is not jaundiced or pale.     Findings: No bruising, erythema, lesion or rash.  Neurological:     Mental Status: He is alert and oriented to person, place, and time.     Cranial Nerves: No cranial nerve deficit.     Motor: No abnormal muscle tone.     Coordination: Coordination normal.     Deep Tendon Reflexes: Reflexes are normal and symmetric.  Psychiatric:        Behavior: Behavior normal.        Thought Content: Thought content normal.        Judgment: Judgment normal.    Wt Readings from Last 3 Encounters:  03/09/20 197 lb (89.4 kg)  12/09/19 198 lb (89.8 kg)  03/08/19 202 lb (91.6 kg)     Exam is significant for laryngitis.  Patient is hoarse today.  He states that he woke up like that this morning but otherwise feels fine      Assessment & Plan:  General medical exam  Patient's blood pressure last year was also elevated.  Patient is a Company secretary.  I will ask him to start checking his blood pressure more frequently at work.  If consistently greater than 140/90, I would recommend medication to lower his blood pressure such as losartan.  I am also very concerned about his cholesterol last year.  His cardiovascular risk over the next 10 years is very low due to his age however I am concerned about leaving this untreated for the long-term.  Therefore I have at least recommended that he start fish oil 2000 mg a day and once his back allows start regular aerobic exercise in an effort to try to lose 10 to 15 pounds.  If the cholesterol is worse, I would recommend a statin.  Patient received his flu shot today.  He is fully vaccinated for Covid.  He is also due for a tetanus shot which he can receive at his convenience.  This is deferred today.

## 2020-03-10 LAB — CBC WITH DIFFERENTIAL/PLATELET
Absolute Monocytes: 1003 cells/uL — ABNORMAL HIGH (ref 200–950)
Basophils Absolute: 65 cells/uL (ref 0–200)
Basophils Relative: 0.6 %
Eosinophils Absolute: 185 cells/uL (ref 15–500)
Eosinophils Relative: 1.7 %
HCT: 47.8 % (ref 38.5–50.0)
Hemoglobin: 15.9 g/dL (ref 13.2–17.1)
Lymphs Abs: 3128 cells/uL (ref 850–3900)
MCH: 28.3 pg (ref 27.0–33.0)
MCHC: 33.3 g/dL (ref 32.0–36.0)
MCV: 85.1 fL (ref 80.0–100.0)
MPV: 10.3 fL (ref 7.5–12.5)
Monocytes Relative: 9.2 %
Neutro Abs: 6518 cells/uL (ref 1500–7800)
Neutrophils Relative %: 59.8 %
Platelets: 262 10*3/uL (ref 140–400)
RBC: 5.62 10*6/uL (ref 4.20–5.80)
RDW: 12.8 % (ref 11.0–15.0)
Total Lymphocyte: 28.7 %
WBC: 10.9 10*3/uL — ABNORMAL HIGH (ref 3.8–10.8)

## 2020-03-10 LAB — COMPLETE METABOLIC PANEL WITH GFR
AG Ratio: 1.4 (calc) (ref 1.0–2.5)
ALT: 36 U/L (ref 9–46)
AST: 18 U/L (ref 10–40)
Albumin: 4.2 g/dL (ref 3.6–5.1)
Alkaline phosphatase (APISO): 92 U/L (ref 36–130)
BUN: 13 mg/dL (ref 7–25)
CO2: 27 mmol/L (ref 20–32)
Calcium: 9.5 mg/dL (ref 8.6–10.3)
Chloride: 104 mmol/L (ref 98–110)
Creat: 1.12 mg/dL (ref 0.60–1.35)
GFR, Est African American: 100 mL/min/{1.73_m2} (ref >=60)
GFR, Est Non African American: 86 mL/min/{1.73_m2} (ref >=60)
Globulin: 3.1 g/dL (calc) (ref 1.9–3.7)
Glucose, Bld: 74 mg/dL (ref 65–99)
Potassium: 4 mmol/L (ref 3.5–5.3)
Sodium: 141 mmol/L (ref 135–146)
Total Bilirubin: 0.7 mg/dL (ref 0.2–1.2)
Total Protein: 7.3 g/dL (ref 6.1–8.1)

## 2020-03-10 LAB — LIPID PANEL
Cholesterol: 243 mg/dL — ABNORMAL HIGH (ref <200)
HDL: 38 mg/dL — ABNORMAL LOW (ref >=40)
LDL Cholesterol (Calc): 173 mg/dL (calc) — ABNORMAL HIGH
Non-HDL Cholesterol (Calc): 205 mg/dL (calc) — ABNORMAL HIGH (ref <130)
Total CHOL/HDL Ratio: 6.4 (calc) — ABNORMAL HIGH (ref <5.0)
Triglycerides: 168 mg/dL — ABNORMAL HIGH (ref <150)

## 2020-03-19 ENCOUNTER — Telehealth: Payer: Self-pay | Admitting: Family Medicine

## 2020-03-19 ENCOUNTER — Other Ambulatory Visit: Payer: Self-pay | Admitting: Family Medicine

## 2020-03-19 MED ORDER — HYDROCODONE-HOMATROPINE 5-1.5 MG/5ML PO SYRP: 5.0000 mL | ORAL_SOLUTION | Freq: Three times a day (TID) | ORAL | 0 refills | Status: DC | PRN

## 2020-03-19 NOTE — Telephone Encounter (Signed)
Message sent thru MyChart 

## 2020-03-19 NOTE — Telephone Encounter (Signed)
Can Dr. Tanya Nones prescribe a mediation for his cough

## 2020-05-15 ENCOUNTER — Other Ambulatory Visit: Payer: Self-pay | Admitting: Otolaryngology

## 2020-05-15 ENCOUNTER — Other Ambulatory Visit (HOSPITAL_COMMUNITY): Payer: Self-pay | Admitting: Otolaryngology

## 2020-05-15 DIAGNOSIS — J320 Chronic maxillary sinusitis: Secondary | ICD-10-CM

## 2020-05-24 ENCOUNTER — Other Ambulatory Visit: Payer: Self-pay

## 2020-05-24 ENCOUNTER — Ambulatory Visit (HOSPITAL_COMMUNITY)
Admission: RE | Admit: 2020-05-24 | Discharge: 2020-05-24 | Disposition: A | Discharge status: Home / Self Care | Payer: PRIVATE HEALTH INSURANCE | Source: Ambulatory Visit | Attending: Otolaryngology | Admitting: Otolaryngology

## 2020-05-24 DIAGNOSIS — J320 Chronic maxillary sinusitis: Secondary | ICD-10-CM | POA: Insufficient documentation

## 2020-07-30 ENCOUNTER — Encounter (HOSPITAL_COMMUNITY): Payer: Self-pay | Admitting: Physical Therapy

## 2020-07-30 NOTE — Therapy (Signed)
[place], [place], [address] Phone: [phone]   Fax:  [phone]  Patient Details  Name: KAILAN LAWS MRN: [id number] Date of Birth: 04-25-1986 Referring Provider:  No ref. provider found  Encounter Date: 07/30/2020   PHYSICAL THERAPY DISCHARGE SUMMARY  Visits from Start of Care: 3  Current functional level related to goals / functional outcomes: Unknown as patient has not returned.    Remaining deficits: Unknown as patient has not returned.       Education / Equipment: HEP  Plan: Patient agrees to discharge.  Patient goals were not met. Patient is being discharged due to not returning since the last visit.  ?????       10:47 AM, 07/30/20 Mearl Latin PT, DPT Physical Therapist at [place], [place], [address] Phone: [phone]   Fax:  [phone]

## 2020-08-28 ENCOUNTER — Ambulatory Visit (INDEPENDENT_AMBULATORY_CARE_PROVIDER_SITE_OTHER): Payer: PRIVATE HEALTH INSURANCE | Admitting: Family Medicine

## 2020-08-28 ENCOUNTER — Other Ambulatory Visit: Payer: Self-pay

## 2020-08-28 ENCOUNTER — Encounter: Payer: Self-pay | Admitting: Family Medicine

## 2020-08-28 VITALS — BP 124/62 | HR 82 | Temp 97.9°F | Resp 14 | Ht 68.0 in | Wt 202.0 lb

## 2020-08-28 DIAGNOSIS — R21 Rash and other nonspecific skin eruption: Secondary | ICD-10-CM | POA: Diagnosis not present

## 2020-08-28 MED ORDER — MOMETASONE FUROATE 0.1 % EX CREA: 1.0000 "application " | TOPICAL_CREAM | Freq: Every day | CUTANEOUS | 0 refills | Status: DC

## 2020-08-28 NOTE — Progress Notes (Signed)
Subjective:    Patient ID: Pedro Douglas, male    DOB: 30-Aug-1985, 35 y.o.   MRN: [id number]  HPI    Patient is a very pleasant 35 year old Caucasian male who presents today with a rash.  The rash is located on the upper medial left thigh adjacent to the scrotum.  Please see the photograph above.  The rash is approximately 3 cm in diameter.  The borders are poorly demarcated.  In the center is a hyperpigmented slightly brownish-purple macule.  There appears to be a central punctate lesion suggesting a previous bite or sting.  Surrounding that are numerous slightly erythematous papules that appear to be urticarial papules.  They are slightly bumpy to the touch.  They surround the central purplish brown macule.  Patient states that it itches slightly.  He has been applying ketoconazole cream twice a day for 3 to 4 weeks ever since he spoke with a doctor over a telephone in late March.  The ketoconazole has not improved the rash at all.  He noticed the rash after clearing brush on a skid steer.  However he did not notice a tick or feel any sting or bite in that area. Past Medical History:  Diagnosis Date  . Dyslipidemia   . HNP (herniated nucleus pulposus), lumbar    l4-5  . Hyperlipidemia    Past Surgical History:  Procedure Laterality Date  . TYMPANOSTOMY TUBE PLACEMENT Bilateral 1990   Current Outpatient Medications on File Prior to Visit  Medication Sig Dispense Refill  . fluticasone (FLONASE) 50 MCG/ACT nasal spray Place 2 sprays into both nostrils daily. 16 g 11  . ketoconazole (NIZORAL) 2 % cream Apply topically daily.    . Omega-3 Fatty Acids (FISH OIL) 1200 MG CPDR Take by mouth.     No current facility-administered medications on file prior to visit.   Allergies  Allergen Reactions  . Banana   . Other   . Watermelon [Citrullus Vulgaris]    Social History   Socioeconomic History  . Marital status: Married    Spouse name: Not on file  . Number of children: Not on  file  . Years of education: Not on file  . Highest education level: Not on file  Occupational History  . Not on file  Tobacco Use  . Smoking status: Never Smoker  . Smokeless tobacco: Former Neurosurgeon    Types: Chew  Substance and Sexual Activity  . Alcohol use: No  . Drug use: No  . Sexual activity: Not on file  Other Topics Concern  . Not on file  Social History Narrative  . Not on file   Social Determinants of Health   Financial Resource Strain: Not on file  Food Insecurity: Not on file  Transportation Needs: Not on file  Physical Activity: Not on file  Stress: Not on file  Social Connections: Not on file  Intimate Partner Violence: Not on file     Review of Systems  All other systems reviewed and are negative.      Objective:   Physical Exam Vitals reviewed.  Constitutional:      Appearance: Normal appearance. He is obese.  Cardiovascular:     Rate and Rhythm: Normal rate and regular rhythm.     Pulses: Normal pulses.     Heart sounds: Normal heart sounds.  Pulmonary:     Effort: Pulmonary effort is normal.     Breath sounds: Normal breath sounds.  Genitourinary:   Skin:  Findings: Rash present.  Neurological:     Mental Status: He is alert.           Assessment & Plan:  Rash and nonspecific skin eruption - Plan: B. burgdorfi antibodies by WB  I am not certain however I do not feel that the rash appears to be any type of fungal infection.  This is the reason I do not feel that the ketoconazole has been effective.  He has tried treatment for tinea corporis as well as intertrigo without any benefit.  I believe that the patient likely suffered an insect bite.  I believe that that was the source of the hyperpigmented macule in the center.  I believe that the erythematous papules and urticarial bumps surrounding are likely localized allergic reaction to the saliva of the insect that bit him.  I suspect a spider bite or perhaps a tick bite.  I will treat  this with Elocon cream applied 1-2 times daily for the next 7 to 14 days.  I will check Lyme titers however I do not feel that this is erythema migrans.

## 2020-08-31 LAB — B. BURGDORFI ANTIBODIES BY WB

## 2021-04-02 ENCOUNTER — Encounter: Payer: Self-pay | Admitting: Family Medicine

## 2021-04-02 ENCOUNTER — Ambulatory Visit (INDEPENDENT_AMBULATORY_CARE_PROVIDER_SITE_OTHER): Payer: No Typology Code available for payment source | Admitting: Family Medicine

## 2021-04-02 ENCOUNTER — Other Ambulatory Visit: Payer: Self-pay

## 2021-04-02 VITALS — BP 128/78 | HR 62 | Temp 97.7°F | Resp 18 | Ht 68.0 in | Wt 206.0 lb

## 2021-04-02 DIAGNOSIS — Z0000 Encounter for general adult medical examination without abnormal findings: Secondary | ICD-10-CM

## 2021-04-02 DIAGNOSIS — L989 Disorder of the skin and subcutaneous tissue, unspecified: Secondary | ICD-10-CM

## 2021-04-02 DIAGNOSIS — E785 Hyperlipidemia, unspecified: Secondary | ICD-10-CM

## 2021-04-02 LAB — COMPLETE METABOLIC PANEL WITH GFR
AG Ratio: 1.4 (calc) (ref 1.0–2.5)
ALT: 54 U/L — ABNORMAL HIGH (ref 9–46)
AST: 25 U/L (ref 10–40)
Albumin: 4.4 g/dL (ref 3.6–5.1)
Alkaline phosphatase (APISO): 103 U/L (ref 36–130)
BUN: 16 mg/dL (ref 7–25)
CO2: 29 mmol/L (ref 20–32)
Calcium: 9.5 mg/dL (ref 8.6–10.3)
Chloride: 104 mmol/L (ref 98–110)
Creat: 1.06 mg/dL (ref 0.60–1.26)
Globulin: 3.2 g/dL (calc) (ref 1.9–3.7)
Glucose, Bld: 94 mg/dL (ref 65–99)
Potassium: 4.7 mmol/L (ref 3.5–5.3)
Sodium: 139 mmol/L (ref 135–146)
Total Bilirubin: 0.5 mg/dL (ref 0.2–1.2)
Total Protein: 7.6 g/dL (ref 6.1–8.1)
eGFR: 95 mL/min/{1.73_m2} (ref >=60)

## 2021-04-02 LAB — CBC WITH DIFFERENTIAL/PLATELET
Absolute Monocytes: 806 cells/uL (ref 200–950)
Basophils Absolute: 63 cells/uL (ref 0–200)
Basophils Relative: 0.8 %
Eosinophils Absolute: 205 cells/uL (ref 15–500)
Eosinophils Relative: 2.6 %
HCT: 49.1 % (ref 38.5–50.0)
Hemoglobin: 16.3 g/dL (ref 13.2–17.1)
Lymphs Abs: 2986 cells/uL (ref 850–3900)
MCH: 28.2 pg (ref 27.0–33.0)
MCHC: 33.2 g/dL (ref 32.0–36.0)
MCV: 85.1 fL (ref 80.0–100.0)
MPV: 10.5 fL (ref 7.5–12.5)
Monocytes Relative: 10.2 %
Neutro Abs: 3839 cells/uL (ref 1500–7800)
Neutrophils Relative %: 48.6 %
Platelets: 245 10*3/uL (ref 140–400)
RBC: 5.77 10*6/uL (ref 4.20–5.80)
RDW: 13 % (ref 11.0–15.0)
Total Lymphocyte: 37.8 %
WBC: 7.9 10*3/uL (ref 3.8–10.8)

## 2021-04-02 LAB — LIPID PANEL
Cholesterol: 228 mg/dL — ABNORMAL HIGH (ref <200)
HDL: 33 mg/dL — ABNORMAL LOW (ref >=40)
LDL Cholesterol (Calc): 156 mg/dL (calc) — ABNORMAL HIGH
Non-HDL Cholesterol (Calc): 195 mg/dL (calc) — ABNORMAL HIGH (ref <130)
Total CHOL/HDL Ratio: 6.9 (calc) — ABNORMAL HIGH (ref <5.0)
Triglycerides: 233 mg/dL — ABNORMAL HIGH (ref <150)

## 2021-04-02 NOTE — Progress Notes (Signed)
Subjective:    Patient ID: Pedro Douglas, male    DOB: 22-Dec-1985, 35 y.o.   MRN: [id number]  HPI Patient is a very pleasant 35 year old Caucasian male here today for complete physical exam.  Overall he is doing very well.  He still has the rash present from April on his upper inner left thigh.  The rash is roughly the diameter of a $0.50 piece.  It is hyperpigmented brownish colored skin but the central area appears to be retracted and erythematous macule roughly 1 cm in diameter.  He states it itches and despite using mometasone consistently for several weeks, the rash has not improved.  He would like to see a dermatologist.  Otherwise he is doing well.  His back pain has improved.  He is due for a flu shot, tetanus shot, and a COVID vaccination Past Medical History:  Diagnosis Date   Dyslipidemia    HNP (herniated nucleus pulposus), lumbar    l4-5   Hyperlipidemia    Past Surgical History:  Procedure Laterality Date   TYMPANOSTOMY TUBE PLACEMENT Bilateral 1990   Current Outpatient Medications on File Prior to Visit  Medication Sig Dispense Refill   fluticasone (FLONASE) 50 MCG/ACT nasal spray Place 2 sprays into both nostrils daily. 16 g 11   ketoconazole (NIZORAL) 2 % cream Apply topically daily.     mometasone (ELOCON) 0.1 % cream Apply 1 application topically daily. 45 g 0   Omega-3 Fatty Acids (FISH OIL) 1200 MG CPDR Take by mouth.     No current facility-administered medications on file prior to visit.    Allergies  Allergen Reactions   Banana    Other    Watermelon [Citrullus Vulgaris]    Social History   Socioeconomic History   Marital status: Married    Spouse name: Not on file   Number of children: Not on file   Years of education: Not on file   Highest education level: Not on file  Occupational History   Not on file  Tobacco Use   Smoking status: Never   Smokeless tobacco: Former    Types: Chew    Quit date: 05/06/2012  Substance and Sexual Activity    Alcohol use: No   Drug use: No   Sexual activity: Not on file  Other Topics Concern   Not on file  Social History Narrative   Not on file   Social Determinants of Health   Financial Resource Strain: Not on file  Food Insecurity: Not on file  Transportation Needs: Not on file  Physical Activity: Not on file  Stress: Not on file  Social Connections: Not on file  Intimate Partner Violence: Not on file   Family History  Problem Relation Age of Onset   Cancer Mother        cervical   Heart disease Paternal Uncle    Cancer Maternal Grandmother        cervical   Diabetes Paternal Grandmother       Review of Systems  All other systems reviewed and are negative.     Objective:   Physical Exam Vitals reviewed.  Constitutional:      General: He is not in acute distress.    Appearance: Normal appearance. He is well-developed. He is obese. He is not ill-appearing, toxic-appearing or diaphoretic.  HENT:     Head: Normocephalic and atraumatic.     Right Ear: Tympanic membrane, ear canal and external ear normal.     Left Ear:  Tympanic membrane, ear canal and external ear normal.     Nose: Nose normal. No congestion or rhinorrhea.     Mouth/Throat:     Pharynx: No oropharyngeal exudate.  Eyes:     General: No scleral icterus.       Right eye: No discharge.        Left eye: No discharge.     Extraocular Movements: Extraocular movements intact.     Conjunctiva/sclera: Conjunctivae normal.     Pupils: Pupils are equal, round, and reactive to light.  Neck:     Thyroid: No thyromegaly.     Vascular: No carotid bruit or JVD.     Trachea: No tracheal deviation.  Cardiovascular:     Rate and Rhythm: Normal rate and regular rhythm.     Heart sounds: Normal heart sounds. No murmur heard.   No friction rub. No gallop.  Pulmonary:     Effort: Pulmonary effort is normal. No respiratory distress.     Breath sounds: Normal breath sounds. No stridor. No wheezing, rhonchi or rales.   Chest:     Chest wall: No tenderness.  Abdominal:     General: Bowel sounds are normal. There is no distension.     Palpations: Abdomen is soft. There is no mass.     Tenderness: There is no abdominal tenderness. There is no guarding or rebound.     Hernia: No hernia is present.  Genitourinary:    Penis: Normal.      Testes: Normal.  Musculoskeletal:        General: No tenderness. Normal range of motion.     Cervical back: Normal range of motion and neck supple. No rigidity.     Right lower leg: No edema.     Left lower leg: No edema.       Legs:  Lymphadenopathy:     Cervical: No cervical adenopathy.  Skin:    General: Skin is warm.     Coloration: Skin is not jaundiced or pale.     Findings: No bruising, erythema, lesion or rash.  Neurological:     Mental Status: He is alert and oriented to person, place, and time.     Cranial Nerves: No cranial nerve deficit.     Motor: No abnormal muscle tone.     Coordination: Coordination normal.     Deep Tendon Reflexes: Reflexes are normal and symmetric.  Psychiatric:        Behavior: Behavior normal.        Thought Content: Thought content normal.        Judgment: Judgment normal.       Assessment & Plan:  General medical exam - Plan: CBC with Differential/Platelet, COMPLETE METABOLIC PANEL WITH GFR, Lipid panel  Dyslipidemia - Plan: CBC with Differential/Platelet, COMPLETE METABOLIC PANEL WITH GFR, Lipid panel  Skin lesion of left leg - Plan: Ambulatory referral to Dermatology  At this point, I believe that the lesion on his upper medial left thigh is likely spongiotic dermatitis or even scarring.  However I would be happy to refer the patient to a dermatologist for a second opinion.  Meanwhile check CBC, CMP, lipid panel.  Blood pressure today is outstanding.  Encourage the patient to get a flu shot.  Also encouraged the patient get the most recent COVID vaccination.  He politely declined both today.  He does consent to a  tetanus shot.  Regular anticipatory guidance is provided

## 2021-04-05 ENCOUNTER — Other Ambulatory Visit: Payer: Self-pay | Admitting: Family Medicine

## 2021-04-05 MED ORDER — ROSUVASTATIN CALCIUM 10 MG PO TABS: 10.0000 mg | ORAL_TABLET | Freq: Every day | ORAL | 3 refills | Status: DC

## 2022-03-04 ENCOUNTER — Telehealth: Payer: Self-pay | Admitting: Family Medicine

## 2022-03-04 NOTE — Telephone Encounter (Signed)
Left message to return call; need to reschedule appt on 11/30. Dr. Dennard Schaumann OOO 11/30-12/1/23.

## 2022-03-06 IMAGING — DX DG LUMBAR SPINE COMPLETE 4+V
5 series · 5 of 5 positions shown · non-contrast
Comparison: None.

CLINICAL DATA: Low back pain for several days, initial encounter

EXAM:
LUMBAR SPINE - COMPLETE 4+ VIEW

[l-spine ap]
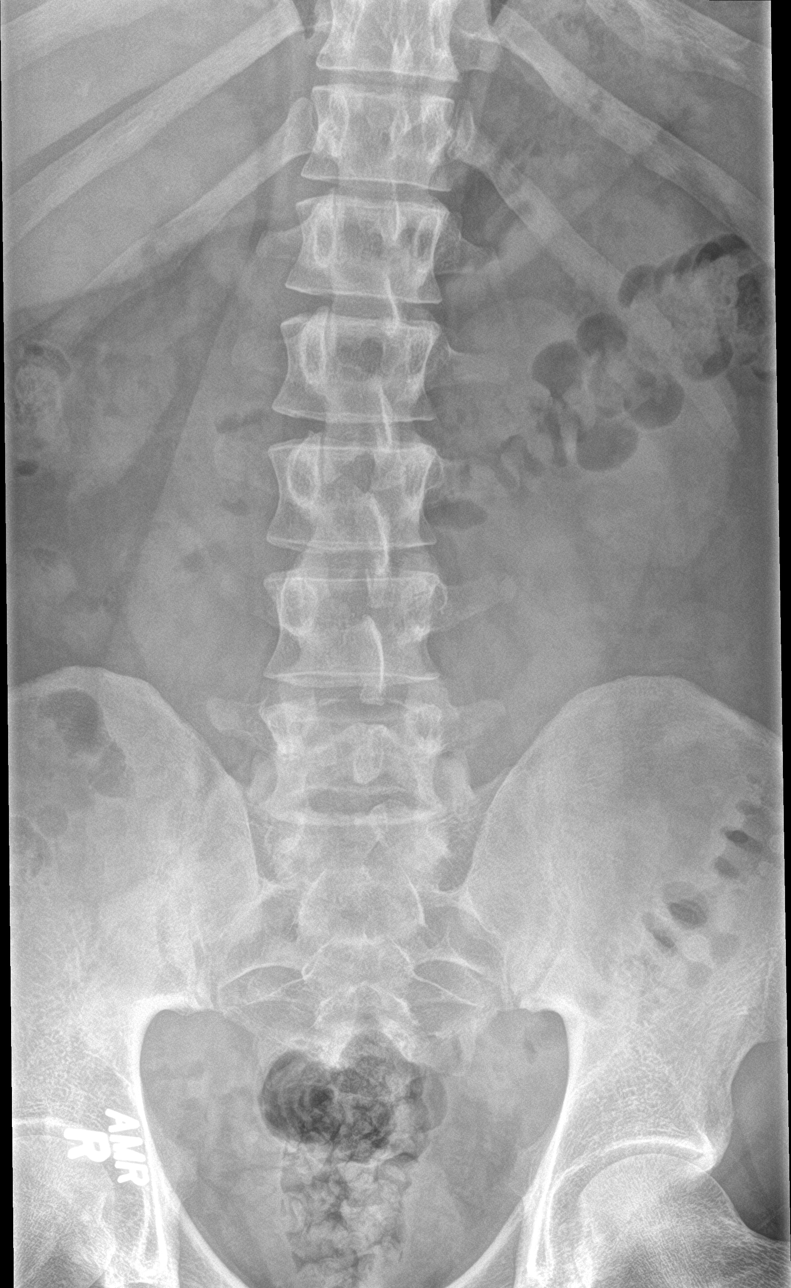

[l-spine obl (1 of 2)]
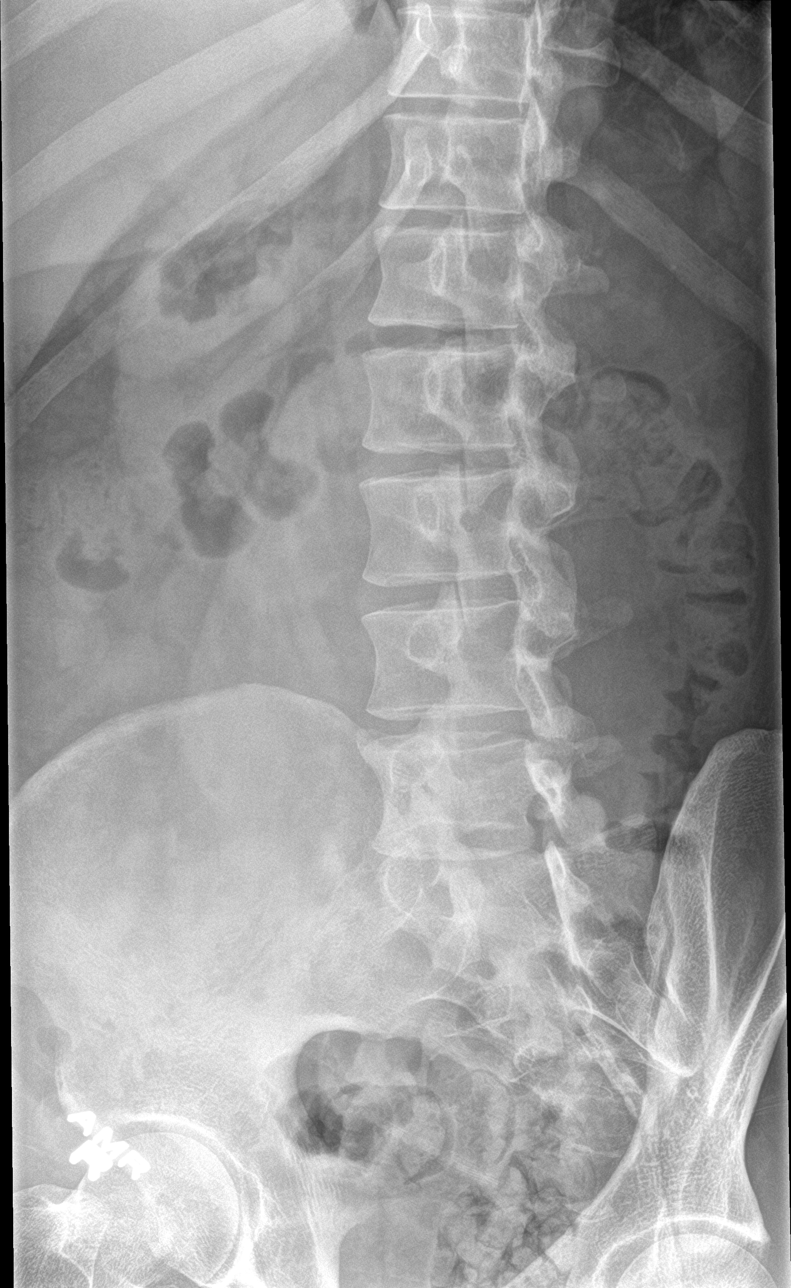

[l-spine obl (2 of 2)]
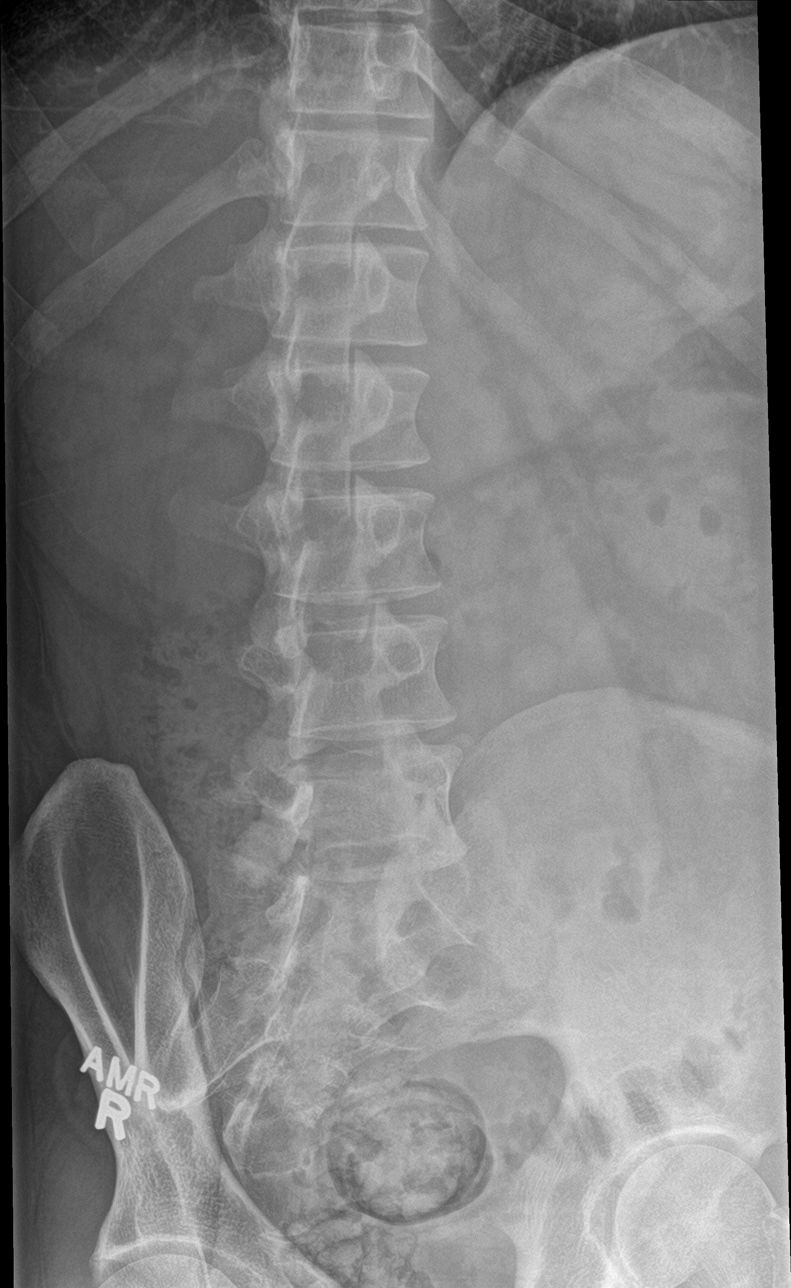

[l-spine lat]
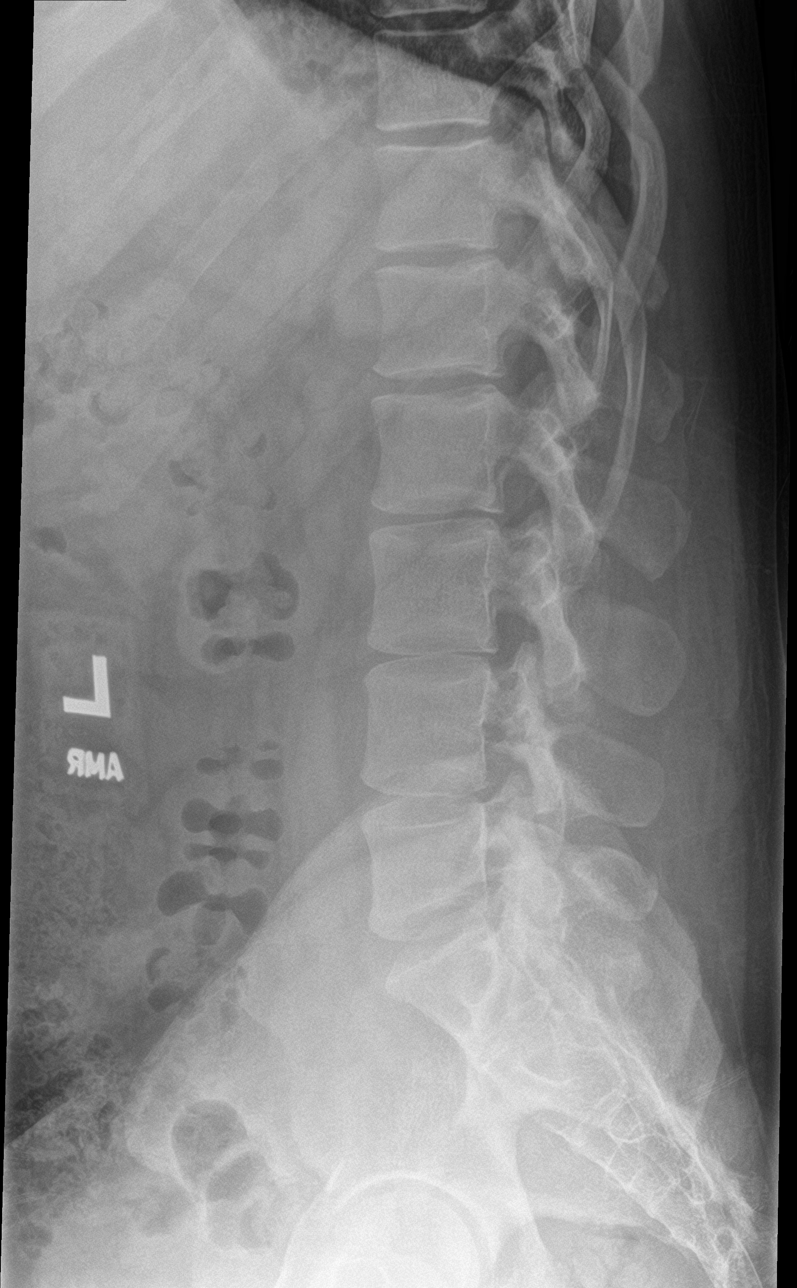

[l-spine spot]
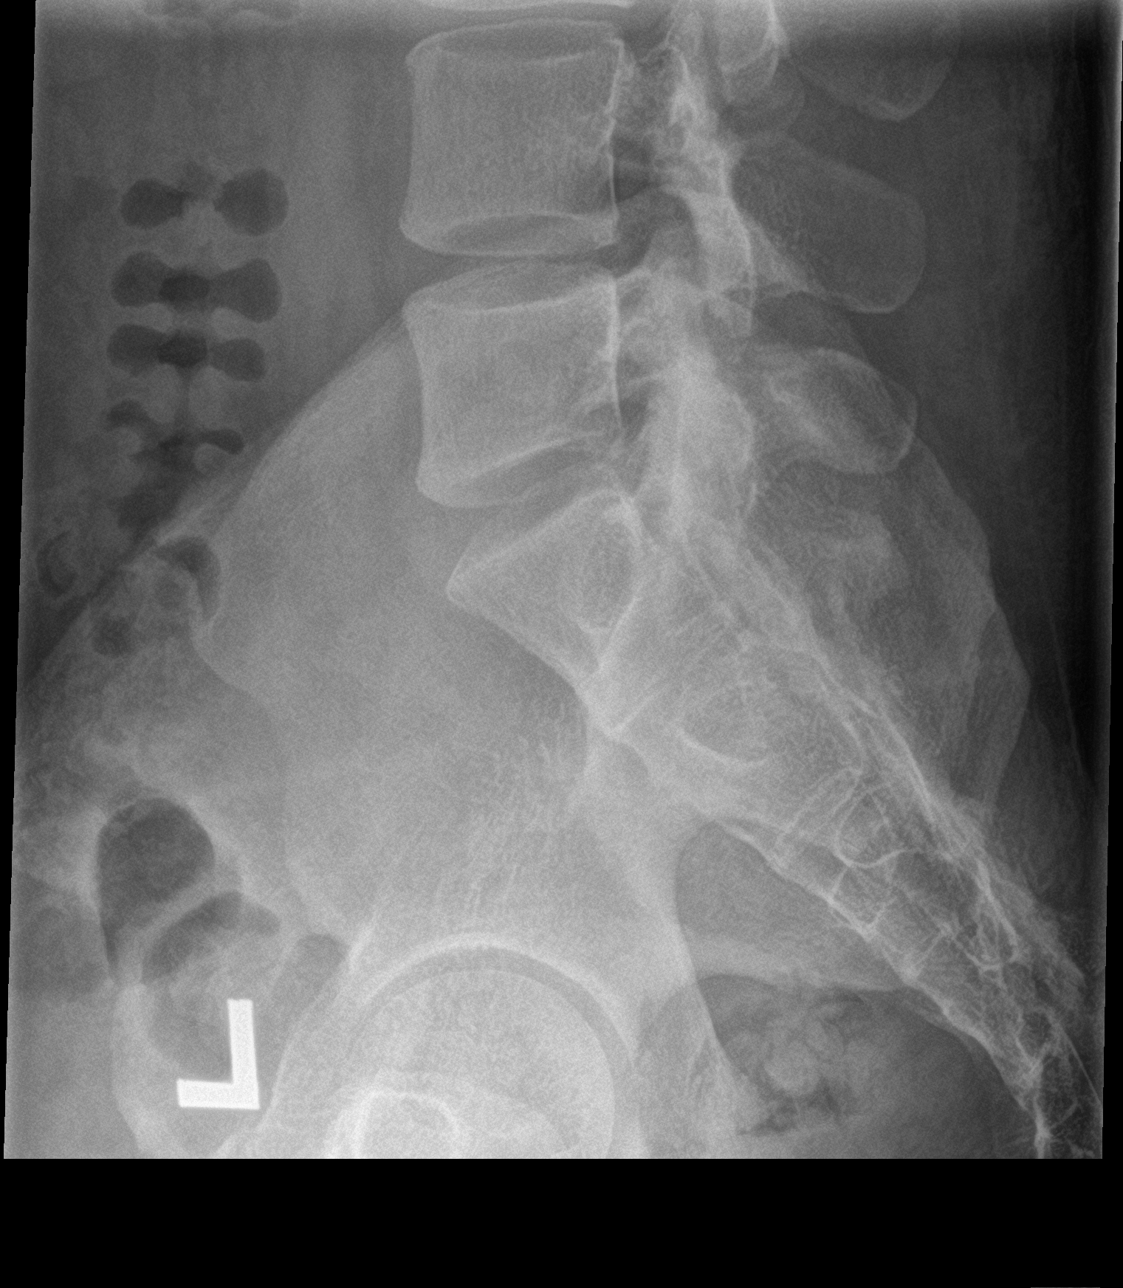

[5 of 5 positions shown; findings below may reference images not displayed]

FINDINGS: Five lumbar type vertebral bodies are well visualized. Vertebral
body height is well maintained. No pars defects are noted. No
anterolisthesis is seen. No soft tissue abnormality is noted. Mild
straightening of the normal lumbar lordosis is noted which may be
related to muscular spasm.
IMPRESSION: Mild straightening of lumbar lordosis likely related to muscular
spasm. No acute bony abnormality is seen.

## 2022-04-03 ENCOUNTER — Encounter: Payer: No Typology Code available for payment source | Admitting: Family Medicine

## 2022-04-21 ENCOUNTER — Encounter: Payer: Self-pay | Admitting: Family Medicine

## 2022-04-21 ENCOUNTER — Ambulatory Visit (INDEPENDENT_AMBULATORY_CARE_PROVIDER_SITE_OTHER): Payer: No Typology Code available for payment source | Admitting: Family Medicine

## 2022-04-21 VITALS — BP 132/82 | HR 74 | Ht 68.0 in | Wt 199.6 lb

## 2022-04-21 DIAGNOSIS — Z0000 Encounter for general adult medical examination without abnormal findings: Secondary | ICD-10-CM | POA: Diagnosis not present

## 2022-04-21 DIAGNOSIS — E7800 Pure hypercholesterolemia, unspecified: Secondary | ICD-10-CM | POA: Diagnosis not present

## 2022-04-21 NOTE — Progress Notes (Signed)
Subjective:    Patient ID: Pedro Douglas, male    DOB: 08/26/1985, 36 y.o.   MRN: [id number]  HPI Patient is a very pleasant 36 year old Caucasian gentleman here today for complete physical exam.  Past medical history is significant for hyperlipidemia as well as degenerative disc disease at L4-L5.  He denies any significant back pain.  He has been taking fish oil for his hyperlipidemia but he admits that his diet has been poor.  He is due to recheck fasting lab work.  He declines a flu shot.  He has an 72-month-old child at home.  He has not yet had his RSV vaccination and I recommended that today. Past Medical History:  Diagnosis Date   Dyslipidemia    HNP (herniated nucleus pulposus), lumbar    l4-5   Hyperlipidemia    Past Surgical History:  Procedure Laterality Date   TYMPANOSTOMY TUBE PLACEMENT Bilateral 1990   Current Outpatient Medications on File Prior to Visit  Medication Sig Dispense Refill   fluticasone (FLONASE) 50 MCG/ACT nasal spray Place 2 sprays into both nostrils daily. 16 g 11   Omega-3 Fatty Acids (FISH OIL) 1200 MG CPDR Take by mouth.     No current facility-administered medications on file prior to visit.    Allergies  Allergen Reactions   Banana    Other    Watermelon [Citrullus Vulgaris]    Social History   Socioeconomic History   Marital status: Married    Spouse name: Not on file   Number of children: Not on file   Years of education: Not on file   Highest education level: Not on file  Occupational History   Not on file  Tobacco Use   Smoking status: Never   Smokeless tobacco: Former    Types: Chew    Quit date: 05/06/2012  Substance and Sexual Activity   Alcohol use: No   Drug use: No   Sexual activity: Not on file  Other Topics Concern   Not on file  Social History Narrative   Not on file   Social Determinants of Health   Financial Resource Strain: Not on file  Food Insecurity: Not on file  Transportation Needs: Not on file   Physical Activity: Not on file  Stress: Not on file  Social Connections: Not on file  Intimate Partner Violence: Not on file   Family History  Problem Relation Age of Onset   Cancer Mother        cervical   Heart disease Paternal Uncle    Cancer Maternal Grandmother        cervical   Diabetes Paternal Grandmother       Review of Systems  All other systems reviewed and are negative.      Objective:   Physical Exam Vitals reviewed.  Constitutional:      General: He is not in acute distress.    Appearance: Normal appearance. He is well-developed. He is obese. He is not ill-appearing, toxic-appearing or diaphoretic.  HENT:     Head: Normocephalic and atraumatic.     Right Ear: Tympanic membrane, ear canal and external ear normal.     Left Ear: Tympanic membrane, ear canal and external ear normal.     Nose: Nose normal. No congestion or rhinorrhea.     Mouth/Throat:     Pharynx: No oropharyngeal exudate.  Eyes:     General: No scleral icterus.       Right eye: No discharge.  Left eye: No discharge.     Extraocular Movements: Extraocular movements intact.     Conjunctiva/sclera: Conjunctivae normal.     Pupils: Pupils are equal, round, and reactive to light.  Neck:     Thyroid: No thyromegaly.     Vascular: No carotid bruit or JVD.     Trachea: No tracheal deviation.  Cardiovascular:     Rate and Rhythm: Normal rate and regular rhythm.     Heart sounds: Normal heart sounds. No murmur heard.    No friction rub. No gallop.  Pulmonary:     Effort: Pulmonary effort is normal. No respiratory distress.     Breath sounds: Normal breath sounds. No stridor. No wheezing, rhonchi or rales.  Chest:     Chest wall: No tenderness.  Abdominal:     General: Bowel sounds are normal. There is no distension.     Palpations: Abdomen is soft. There is no mass.     Tenderness: There is no abdominal tenderness. There is no guarding or rebound.     Hernia: No hernia is present.   Genitourinary:    Penis: Normal.      Testes: Normal.  Musculoskeletal:        General: No tenderness. Normal range of motion.     Cervical back: Normal range of motion and neck supple. No rigidity.     Right lower leg: No edema.     Left lower leg: No edema.       Legs:  Lymphadenopathy:     Cervical: No cervical adenopathy.  Skin:    General: Skin is warm.     Coloration: Skin is not jaundiced or pale.     Findings: No bruising, erythema, lesion or rash.  Neurological:     Mental Status: He is alert and oriented to person, place, and time.     Cranial Nerves: No cranial nerve deficit.     Motor: No abnormal muscle tone.     Coordination: Coordination normal.     Deep Tendon Reflexes: Reflexes are normal and symmetric.  Psychiatric:        Behavior: Behavior normal.        Thought Content: Thought content normal.        Judgment: Judgment normal.        Assessment & Plan:  Pure hypercholesterolemia - Plan: CBC with Differential/Platelet, COMPLETE METABOLIC PANEL WITH GFR, Lipid panel  General medical exam At this point, the lesion on his upper medial left thigh I believe is a keloid or scar tissue.  He has a second opinion scheduled with dermatology.  He is not yet due for cancer screening.  Recommended a flu shot as well as an RSV shot.  Check CBC CMP and a lipid panel.  We reviewed his cholesterol from his last visit.  We discussed starting a statin but he would like to get serious about his diet and exercise and then recheck labs in 3 to 6 months.

## 2022-04-22 ENCOUNTER — Other Ambulatory Visit: Payer: No Typology Code available for payment source

## 2022-04-23 LAB — CBC WITH DIFFERENTIAL/PLATELET
Absolute Monocytes: 738 cells/uL (ref 200–950)
Basophils Absolute: 74 cells/uL (ref 0–200)
Basophils Relative: 0.9 %
Eosinophils Absolute: 443 cells/uL (ref 15–500)
Eosinophils Relative: 5.4 %
HCT: 50.1 % — ABNORMAL HIGH (ref 38.5–50.0)
Hemoglobin: 17 g/dL (ref 13.2–17.1)
Lymphs Abs: 3255 cells/uL (ref 850–3900)
MCH: 27.9 pg (ref 27.0–33.0)
MCHC: 33.9 g/dL (ref 32.0–36.0)
MCV: 82.3 fL (ref 80.0–100.0)
MPV: 10.4 fL (ref 7.5–12.5)
Monocytes Relative: 9 %
Neutro Abs: 3690 cells/uL (ref 1500–7800)
Neutrophils Relative %: 45 %
Platelets: 263 10*3/uL (ref 140–400)
RBC: 6.09 10*6/uL — ABNORMAL HIGH (ref 4.20–5.80)
RDW: 12.8 % (ref 11.0–15.0)
Total Lymphocyte: 39.7 %
WBC: 8.2 10*3/uL (ref 3.8–10.8)

## 2022-04-23 LAB — COMPLETE METABOLIC PANEL WITH GFR
AG Ratio: 1.3 (calc) (ref 1.0–2.5)
ALT: 55 U/L — ABNORMAL HIGH (ref 9–46)
AST: 21 U/L (ref 10–40)
Albumin: 4.4 g/dL (ref 3.6–5.1)
Alkaline phosphatase (APISO): 114 U/L (ref 36–130)
BUN: 11 mg/dL (ref 7–25)
CO2: 27 mmol/L (ref 20–32)
Calcium: 9.7 mg/dL (ref 8.6–10.3)
Chloride: 104 mmol/L (ref 98–110)
Creat: 1.08 mg/dL (ref 0.60–1.26)
Globulin: 3.4 g/dL (calc) (ref 1.9–3.7)
Glucose, Bld: 93 mg/dL (ref 65–99)
Potassium: 4.6 mmol/L (ref 3.5–5.3)
Sodium: 141 mmol/L (ref 135–146)
Total Bilirubin: 0.5 mg/dL (ref 0.2–1.2)
Total Protein: 7.8 g/dL (ref 6.1–8.1)
eGFR: 92 mL/min/{1.73_m2} (ref >=60)

## 2022-04-23 LAB — LIPID PANEL
Cholesterol: 246 mg/dL — ABNORMAL HIGH (ref <200)
HDL: 40 mg/dL (ref >=40)
LDL Cholesterol (Calc): 166 mg/dL (calc) — ABNORMAL HIGH
Non-HDL Cholesterol (Calc): 206 mg/dL (calc) — ABNORMAL HIGH (ref <130)
Total CHOL/HDL Ratio: 6.2 (calc) — ABNORMAL HIGH (ref <5.0)
Triglycerides: 236 mg/dL — ABNORMAL HIGH (ref <150)

## 2022-07-24 ENCOUNTER — Ambulatory Visit: Payer: No Typology Code available for payment source | Admitting: Family Medicine

## 2022-09-17 ENCOUNTER — Emergency Department (HOSPITAL_COMMUNITY)
Admission: EM | Admit: 2022-09-17 | Discharge: 2022-09-17 | Disposition: A | Discharge status: Home / Self Care | Payer: No Typology Code available for payment source | Attending: Emergency Medicine | Admitting: Emergency Medicine

## 2022-09-17 ENCOUNTER — Emergency Department (HOSPITAL_COMMUNITY): Payer: No Typology Code available for payment source

## 2022-09-17 ENCOUNTER — Encounter (HOSPITAL_COMMUNITY): Payer: Self-pay | Admitting: *Deleted

## 2022-09-17 ENCOUNTER — Other Ambulatory Visit: Payer: Self-pay

## 2022-09-17 DIAGNOSIS — I10 Essential (primary) hypertension: Secondary | ICD-10-CM | POA: Insufficient documentation

## 2022-09-17 DIAGNOSIS — N132 Hydronephrosis with renal and ureteral calculous obstruction: Secondary | ICD-10-CM | POA: Diagnosis not present

## 2022-09-17 DIAGNOSIS — N200 Calculus of kidney: Secondary | ICD-10-CM

## 2022-09-17 DIAGNOSIS — R109 Unspecified abdominal pain: Secondary | ICD-10-CM | POA: Diagnosis present

## 2022-09-17 DIAGNOSIS — R112 Nausea with vomiting, unspecified: Secondary | ICD-10-CM

## 2022-09-17 LAB — URINALYSIS, ROUTINE W REFLEX MICROSCOPIC
Bilirubin Urine: NEGATIVE
Glucose, UA: NEGATIVE mg/dL
Ketones, ur: NEGATIVE mg/dL
Leukocytes,Ua: NEGATIVE
Nitrite: NEGATIVE
Protein, ur: NEGATIVE mg/dL
RBC / HPF: >50 RBC/hpf (ref 0–5)
Specific Gravity, Urine: 1.014 (ref 1.005–1.030)
pH: 7 (ref 5.0–8.0)

## 2022-09-17 LAB — CBC WITH DIFFERENTIAL/PLATELET
Abs Immature Granulocytes: 0.07 10*3/uL (ref 0.00–0.07)
Basophils Absolute: 0.1 10*3/uL (ref 0.0–0.1)
Basophils Relative: 1 %
Eosinophils Absolute: 0.5 10*3/uL (ref 0.0–0.5)
Eosinophils Relative: 4 %
HCT: 48.2 % (ref 39.0–52.0)
Hemoglobin: 16.7 g/dL (ref 13.0–17.0)
Immature Granulocytes: 1 %
Lymphocytes Relative: 40 %
Lymphs Abs: 4.5 10*3/uL — ABNORMAL HIGH (ref 0.7–4.0)
MCH: 27.7 pg (ref 26.0–34.0)
MCHC: 34.6 g/dL (ref 30.0–36.0)
MCV: 80.1 fL (ref 80.0–100.0)
Monocytes Absolute: 0.9 10*3/uL (ref 0.1–1.0)
Monocytes Relative: 8 %
Neutro Abs: 5.2 10*3/uL (ref 1.7–7.7)
Neutrophils Relative %: 46 %
Platelets: 286 10*3/uL (ref 150–400)
RBC: 6.02 MIL/uL — ABNORMAL HIGH (ref 4.22–5.81)
RDW: 13.3 % (ref 11.5–15.5)
WBC: 11.2 10*3/uL — ABNORMAL HIGH (ref 4.0–10.5)
nRBC: 0 % (ref 0.0–0.2)

## 2022-09-17 LAB — LIPASE, BLOOD: Lipase: 36 U/L (ref 11–51)

## 2022-09-17 LAB — COMPREHENSIVE METABOLIC PANEL
ALT: 67 U/L — ABNORMAL HIGH (ref 0–44)
AST: 37 U/L (ref 15–41)
Albumin: 4.5 g/dL (ref 3.5–5.0)
Alkaline Phosphatase: 112 U/L (ref 38–126)
Anion gap: 14 (ref 5–15)
BUN: 13 mg/dL (ref 6–20)
CO2: 21 mmol/L — ABNORMAL LOW (ref 22–32)
Calcium: 9.6 mg/dL (ref 8.9–10.3)
Chloride: 103 mmol/L (ref 98–111)
Creatinine, Ser: 1.16 mg/dL (ref 0.61–1.24)
GFR, Estimated: >60 mL/min (ref >60)
Glucose, Bld: 107 mg/dL — ABNORMAL HIGH (ref 70–99)
Potassium: 3.4 mmol/L — ABNORMAL LOW (ref 3.5–5.1)
Sodium: 138 mmol/L (ref 135–145)
Total Bilirubin: 0.8 mg/dL (ref 0.3–1.2)
Total Protein: 8.4 g/dL — ABNORMAL HIGH (ref 6.5–8.1)

## 2022-09-17 MED ORDER — TAMSULOSIN HCL 0.4 MG PO CAPS: 0.4000 mg | ORAL_CAPSULE | Freq: Every day | ORAL | 0 refills | Status: AC

## 2022-09-17 MED ORDER — LACTATED RINGERS IV BOLUS
1000.0000 mL | Freq: Once | INTRAVENOUS | Status: AC
  Administered 2022-09-17: 1000 mL via INTRAVENOUS

## 2022-09-17 MED ORDER — ONDANSETRON 4 MG PO TBDP: 4.0000 mg | ORAL_TABLET | Freq: Once | ORAL | Status: DC

## 2022-09-17 MED ORDER — MORPHINE SULFATE (PF) 4 MG/ML IV SOLN
4.0000 mg | Freq: Once | INTRAVENOUS | Status: AC
  Administered 2022-09-17: 4 mg via INTRAVENOUS
  Filled 2022-09-17: qty 1

## 2022-09-17 MED ORDER — TAMSULOSIN HCL 0.4 MG PO CAPS
0.4000 mg | ORAL_CAPSULE | ORAL | Status: AC
  Administered 2022-09-17: 0.4 mg via ORAL
  Filled 2022-09-17: qty 1

## 2022-09-17 MED ORDER — ONDANSETRON 4 MG PO TBDP: 4.0000 mg | ORAL_TABLET | Freq: Three times a day (TID) | ORAL | 0 refills | Status: AC | PRN

## 2022-09-17 MED ORDER — FENTANYL CITRATE (PF) 100 MCG/2ML IJ SOLN
100.0000 ug | Freq: Once | INTRAMUSCULAR | Status: AC
  Administered 2022-09-17: 100 ug via INTRAVENOUS
  Filled 2022-09-17: qty 2

## 2022-09-17 MED ORDER — KETOROLAC TROMETHAMINE 15 MG/ML IJ SOLN
15.0000 mg | Freq: Once | INTRAMUSCULAR | Status: AC
  Administered 2022-09-17: 15 mg via INTRAVENOUS
  Filled 2022-09-17: qty 1

## 2022-09-17 MED ORDER — ONDANSETRON HCL 4 MG/2ML IJ SOLN
4.0000 mg | Freq: Once | INTRAMUSCULAR | Status: AC
  Administered 2022-09-17: 4 mg via INTRAVENOUS
  Filled 2022-09-17: qty 2

## 2022-09-17 MED ORDER — OXYCODONE HCL 5 MG PO TABS: 5.0000 mg | ORAL_TABLET | ORAL | Status: DC

## 2022-09-17 MED ORDER — OXYCODONE HCL 5 MG PO TABS: 5.0000 mg | ORAL_TABLET | ORAL | 0 refills | Status: AC | PRN

## 2022-09-17 NOTE — ED Provider Notes (Incomplete)
   EMERGENCY DEPARTMENT AT [facility] Provider Note   CSN: [id number] Arrival date & time: 09/17/22  1810     History {Add pertinent medical, surgical, social history, OB history to HPI:1} Chief Complaint  Patient presents with   Flank Pain    Pedro Douglas is a 37 y.o. male.  45 mnis ago L back  Can't urinate Nbnb emesis No abd surg       Home Medications Prior to Admission medications   Medication Sig Start Date End Date Taking? Authorizing Provider  fluticasone (FLONASE) 50 MCG/ACT nasal spray Place 2 sprays into both nostrils daily. 01/16/20   Donita Brooks, MD  Omega-3 Fatty Acids (FISH OIL) 1200 MG CPDR Take by mouth.    [provider]      Allergies    Banana, Other, and Watermelon [citrullus vulgaris]    Review of Systems   Review of Systems  Physical Exam Updated Vital Signs BP (!) 172/97 (BP Location: Left Arm)   Pulse 66   Temp (!) 97.5 F (36.4 C) (Oral)   Resp (!) 22   Ht 5\' 8"  (1.727 m)   Wt 88.5 kg   SpO2 98%   BMI 29.65 kg/m  Physical Exam  ED Results / Procedures / Treatments   Labs (all labs ordered are listed, but only abnormal results are displayed) Labs Reviewed  URINALYSIS, ROUTINE W REFLEX MICROSCOPIC  COMPREHENSIVE METABOLIC PANEL  LIPASE, BLOOD  CBC WITH DIFFERENTIAL/PLATELET    EKG None  Radiology No results found.  Procedures Procedures  {Document cardiac monitor, telemetry assessment procedure when appropriate:1}  Medications Ordered in ED Medications  ondansetron (ZOFRAN) injection 4 mg (has no administration in time range)  fentaNYL (SUBLIMAZE) injection 100 mcg (has no administration in time range)  lactated ringers bolus 1,000 mL (has no administration in time range)    ED Course/ Medical Decision Making/ A&P   {   Click here for ABCD2, HEART and other calculatorsREFRESH Note before signing :1}                          Medical Decision Making Amount and/or  Complexity of Data Reviewed Labs: ordered. Radiology: ordered.  Risk Prescription drug management.   ***  {Document critical care time when appropriate:1} {Document review of labs and clinical decision tools ie heart score, Chads2Vasc2 etc:1}  {Document your independent review of radiology images, and any outside records:1} {Document your discussion with family members, caretakers, and with consultants:1} {Document social determinants of health affecting pt's care:1} {Document your decision making why or why not admission, treatments were needed:1} Final Clinical Impression(s) / ED Diagnoses Final diagnoses:  None    Rx / DC Orders ED Discharge Orders     None

## 2022-09-17 NOTE — Discharge Instructions (Signed)
You were seen for your kidney stone in the emergency department.    At home, please take Tylenol and ibuprofen for your pain. You may also take the oxycodone we have prescribed you for any breakthrough pain that may have.  Do not take this before driving or operating heavy machinery.  Do not take this medication with alcohol.  Use the flowmax we have give you every day until the kidney stone passes. Please also strain your urine to collect the kidney stone. Store it in a container and take it to your urologist for analysis.   Follow-up with urology in a week to discuss your symptoms.   Return immediately to the emergency department if you experience any of the following: fever, unbearable pain, urinary retention, or any other concerning symptoms.    Thank you for visiting our Emergency Department. It was a pleasure taking care of you today.   

## 2022-09-17 NOTE — ED Triage Notes (Signed)
Pt with sudden left flank pain with emesis.  Denies hx of kidney stones.

## 2022-09-18 ENCOUNTER — Telehealth: Payer: Self-pay

## 2022-09-18 NOTE — Transitions of Care (Post Inpatient/ED Visit) (Signed)
   09/18/2022  Name: Pedro Douglas MRN: [id number] DOB: [birth year]  Today's TOC FU Call Status:    Attempted to reach the patient regarding the most recent Inpatient/ED visit.  Follow Up Plan: Additional outreach attempts will be made to reach the patient to complete the Transitions of Care (Post Inpatient/ED visit) call.   Signature  Fredirick Maudlin

## 2022-09-19 NOTE — Transitions of Care (Post Inpatient/ED Visit) (Signed)
   09/19/2022  Name: Pedro Douglas MRN: [id number] DOB: [birth year]  Today's TOC FU Call Status: Today's TOC FU Call Status:: Successful TOC FU Call Competed TOC FU Call Complete Date: 09/19/22  Transition Care Management Follow-up Telephone Call Date of Discharge: 09/17/22 [facility] Type of Discharge: Emergency Department Reason for ED Visit: Other: (kindey stones) How have you been since you were released from the hospital?: Better Any questions or concerns?: No  Items Reviewed: Did you receive and understand the discharge instructions provided?: Yes Medications obtained,verified, and reconciled?: Yes (Medications Reviewed) Any new allergies since your discharge?: No Dietary orders reviewed?: No Do you have support at home?: Yes People in Home: friend(s)  Medications Reviewed Today: Medications Reviewed Today     Reviewed by Annabell Sabal, CMA (Certified Medical Assistant) on 09/19/22 at 1043  Med List Status: <None>   Medication Order Taking? Sig Documenting Provider Last Dose Status Informant  fluticasone (FLONASE) 50 MCG/ACT nasal spray [id number] Yes Place 2 sprays into both nostrils daily. Donita Brooks, MD Taking Active   Omega-3 Fatty Acids (FISH OIL) 1200 MG CPDR [id number] Yes Take by mouth. [provider] Taking Active   ondansetron (ZOFRAN-ODT) 4 MG disintegrating tablet [id number] Yes Take 1 tablet (4 mg total) by mouth every 8 (eight) hours as needed for nausea or vomiting. Rondel Baton, MD Taking Active   oxyCODONE (ROXICODONE) 5 MG immediate release tablet [id number] Yes Take 1 tablet (5 mg total) by mouth every 4 (four) hours as needed for severe pain. Rondel Baton, MD Taking Active   tamsulosin [facility]) 0.4 MG CAPS capsule [id number] Yes Take 1 capsule (0.4 mg total) by mouth daily. Rondel Baton, MD Taking Active             Home Care and Equipment/Supplies: Were Home Health Services Ordered?:  No Any new equipment or medical supplies ordered?: No  Functional Questionnaire: Do you need assistance with bathing/showering or dressing?: No Do you need assistance with meal preparation?: No Do you need assistance with eating?: No Do you have difficulty maintaining continence: No Do you need assistance with getting out of bed/getting out of a chair/moving?: No Do you have difficulty managing or taking your medications?: No  Follow up appointments reviewed: PCP Follow-up appointment confirmed?: No MD Provider Line Number:[phone] Given: Yes Specialist Hospital Follow-up appointment confirmed?: No Reason Specialist Follow-Up Not Confirmed: Patient has Specialist Provider Number and will Call for Appointment Do you need transportation to your follow-up appointment?: No Do you understand care options if your condition(s) worsen?: Yes-patient verbalized understanding    SIGNATURE Annabell Sabal CMA [facility]

## 2022-12-05 ENCOUNTER — Encounter: Payer: No Typology Code available for payment source | Admitting: Family Medicine
